# Patient Record
Sex: Female | Born: 1985
Health system: Southern US, Community
[De-identification: ages and names within clinical notes are randomized; demographics above are authoritative.]

## PROBLEM LIST (undated history)

## (undated) DIAGNOSIS — Z8709 Personal history of other diseases of the respiratory system: Secondary | ICD-10-CM

## (undated) DIAGNOSIS — Z8669 Personal history of other diseases of the nervous system and sense organs: Secondary | ICD-10-CM

## (undated) DIAGNOSIS — R87619 Unspecified abnormal cytological findings in specimens from cervix uteri: Secondary | ICD-10-CM

## (undated) DIAGNOSIS — N920 Excessive and frequent menstruation with regular cycle: Secondary | ICD-10-CM

## (undated) DIAGNOSIS — I10 Essential (primary) hypertension: Secondary | ICD-10-CM

## (undated) DIAGNOSIS — A64 Unspecified sexually transmitted disease: Secondary | ICD-10-CM

## (undated) DIAGNOSIS — T7840XA Allergy, unspecified, initial encounter: Secondary | ICD-10-CM

## (undated) DIAGNOSIS — Z87898 Personal history of other specified conditions: Secondary | ICD-10-CM

## (undated) DIAGNOSIS — F3181 Bipolar II disorder: Secondary | ICD-10-CM

## (undated) DIAGNOSIS — E282 Polycystic ovarian syndrome: Secondary | ICD-10-CM

## (undated) DIAGNOSIS — K219 Gastro-esophageal reflux disease without esophagitis: Secondary | ICD-10-CM

## (undated) DIAGNOSIS — F419 Anxiety disorder, unspecified: Secondary | ICD-10-CM

## (undated) DIAGNOSIS — Z8619 Personal history of other infectious and parasitic diseases: Secondary | ICD-10-CM

## (undated) DIAGNOSIS — IMO0002 Reserved for concepts with insufficient information to code with codable children: Secondary | ICD-10-CM

## (undated) DIAGNOSIS — F329 Major depressive disorder, single episode, unspecified: Secondary | ICD-10-CM

## (undated) DIAGNOSIS — N912 Amenorrhea, unspecified: Secondary | ICD-10-CM

## (undated) DIAGNOSIS — F32A Depression, unspecified: Secondary | ICD-10-CM

## (undated) HISTORY — DX: Personal history of other diseases of the respiratory system: Z87.09

## (undated) HISTORY — DX: Unspecified sexually transmitted disease: A64

## (undated) HISTORY — DX: Bipolar II disorder: F31.81

## (undated) HISTORY — DX: Anxiety disorder, unspecified: F41.9

## (undated) HISTORY — DX: Excessive and frequent menstruation with regular cycle: N92.0

## (undated) HISTORY — DX: Polycystic ovarian syndrome: E28.2

## (undated) HISTORY — DX: Personal history of other diseases of the nervous system and sense organs: Z86.69

## (undated) HISTORY — DX: Major depressive disorder, single episode, unspecified: F32.9

## (undated) HISTORY — DX: Reserved for concepts with insufficient information to code with codable children: IMO0002

## (undated) HISTORY — DX: Amenorrhea, unspecified: N91.2

## (undated) HISTORY — DX: Allergy, unspecified, initial encounter: T78.40XA

## (undated) HISTORY — DX: Personal history of other specified conditions: Z87.898

## (undated) HISTORY — DX: Unspecified abnormal cytological findings in specimens from cervix uteri: R87.619

## (undated) HISTORY — DX: Essential (primary) hypertension: I10

## (undated) HISTORY — DX: Personal history of other infectious and parasitic diseases: Z86.19

## (undated) HISTORY — DX: Gastro-esophageal reflux disease without esophagitis: K21.9

## (undated) HISTORY — DX: Depression, unspecified: F32.A

---
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
## 2006-04-11 ENCOUNTER — Other Ambulatory Visit: Admission: RE | Admit: 2006-04-11 | Discharge: 2006-04-11 | Payer: Self-pay | Admitting: Obstetrics & Gynecology

## 2006-08-07 ENCOUNTER — Ambulatory Visit: Payer: Self-pay | Admitting: Internal Medicine

## 2006-08-08 ENCOUNTER — Encounter: Admission: RE | Admit: 2006-08-08 | Discharge: 2006-08-08 | Payer: Self-pay | Admitting: Internal Medicine

## 2006-10-11 ENCOUNTER — Ambulatory Visit: Payer: Self-pay | Admitting: Internal Medicine

## 2007-02-07 ENCOUNTER — Ambulatory Visit: Payer: Self-pay | Admitting: Internal Medicine

## 2008-05-23 ENCOUNTER — Emergency Department (HOSPITAL_BASED_OUTPATIENT_CLINIC_OR_DEPARTMENT_OTHER): Admission: EM | Admit: 2008-05-23 | Discharge: 2008-05-23 | Payer: Self-pay | Admitting: Emergency Medicine

## 2008-07-17 ENCOUNTER — Other Ambulatory Visit: Admission: RE | Admit: 2008-07-17 | Discharge: 2008-07-17 | Payer: Self-pay | Admitting: Obstetrics and Gynecology

## 2010-07-12 LAB — HM PAP SMEAR: HM Pap smear: NORMAL

## 2011-03-21 ENCOUNTER — Encounter: Payer: Self-pay | Admitting: Internal Medicine

## 2011-03-21 ENCOUNTER — Ambulatory Visit (INDEPENDENT_AMBULATORY_CARE_PROVIDER_SITE_OTHER): Payer: BC Managed Care – PPO | Admitting: Internal Medicine

## 2011-03-21 DIAGNOSIS — M545 Low back pain, unspecified: Secondary | ICD-10-CM

## 2011-03-21 DIAGNOSIS — J45909 Unspecified asthma, uncomplicated: Secondary | ICD-10-CM | POA: Insufficient documentation

## 2011-03-21 DIAGNOSIS — K5900 Constipation, unspecified: Secondary | ICD-10-CM | POA: Insufficient documentation

## 2011-03-21 DIAGNOSIS — G43909 Migraine, unspecified, not intractable, without status migrainosus: Secondary | ICD-10-CM | POA: Insufficient documentation

## 2011-03-21 DIAGNOSIS — J309 Allergic rhinitis, unspecified: Secondary | ICD-10-CM

## 2011-03-21 NOTE — Progress Notes (Signed)
  Subjective:    Patient ID: Jessica Castro, female    DOB: [birth year], 25 y.o.   MRN: [id number]  HPI Pt presents to clinic to establish care and for evaluation of constipation. Notes one year h/o constipation that specifically began after taking short course of vicodin for back pain. Since that time has had chronic intermittent constipation with small difficult to pass bm. Sometimes associated with cramping and gas. Seen by multiple providers and recalls reportedly nl thyroid test and labwork in general, a plain radiograph that suggested constipation without mention of obstruction and did see GI without any studies. Has changed her diet, increased fluid intake, attempted one month of benefiber, miralax all without significant improvement. During the year had increased anxiety and attempted prozac but stopped due to side effects. Did see a Veterinary surgeon. Denies recent anxiety. H/o lumbar back pain with reported herniated disc which flared in 2007 and 2011. S/p nsaids and PT. No recent back pain. No other alleviating or exacerbating factors.   Reviewed pmh, psh, medications, allergies, soc hx and fam hx  Review of Systems  Constitutional: Negative for fatigue and unexpected weight change.  Gastrointestinal: Positive for constipation. Negative for nausea, abdominal pain, diarrhea, blood in stool and abdominal distention.  Skin: Negative for color change and rash.  Neurological: Positive for headaches. Negative for seizures.  Psychiatric/Behavioral: Negative for dysphoric mood. The patient is not nervous/anxious.        Objective:   Physical Exam  Physical Exam  Nursing note and vitals reviewed. Constitutional: Appears well-developed and well-nourished. No distress.  HENT:  Head: Normocephalic and atraumatic.  Right Ear: External ear normal.  Left Ear: External ear normal.  Eyes: Conjunctivae are normal. No scleral icterus.  Neck: Neck supple.  Cardiovascular: Normal rate, regular rhythm and  normal heart sounds.  Exam reveals no gallop and no friction rub.   No murmur heard. Pulmonary/Chest: Effort normal and breath sounds normal. No respiratory distress. He has no wheezes. no rales.  Abd: soft, nd, +bs. No masses or organomegaly. Mild diffuse tenderness more prominent lower quandrants. No rebound/guarding or rigidity. Lymphadenopathy:    He has no cervical adenopathy.  Neurological:Alert.  Skin: Skin is warm and dry. Not diaphoretic.  Psychiatric: Has a normal mood and affect.        Assessment & Plan:

## 2011-03-21 NOTE — Assessment & Plan Note (Signed)
Reviewed workup/evaluation in detail with patient. May represent constipation dominant IBS that occurred after narcotic. Workup to date unrevealing. Attempt 3wk course of align qd (samples provided.) Recommend GI followup (pt in the process of scheduling.) Schedule f/u.

## 2011-05-19 ENCOUNTER — Ambulatory Visit: Payer: BC Managed Care – PPO | Admitting: Internal Medicine

## 2011-06-01 ENCOUNTER — Encounter: Payer: Self-pay | Admitting: Family

## 2011-06-01 ENCOUNTER — Ambulatory Visit (INDEPENDENT_AMBULATORY_CARE_PROVIDER_SITE_OTHER): Payer: BC Managed Care – PPO | Admitting: Family

## 2011-06-01 DIAGNOSIS — B373 Candidiasis of vulva and vagina: Secondary | ICD-10-CM

## 2011-06-01 DIAGNOSIS — B3731 Acute candidiasis of vulva and vagina: Secondary | ICD-10-CM

## 2011-06-01 DIAGNOSIS — K649 Unspecified hemorrhoids: Secondary | ICD-10-CM

## 2011-06-01 DIAGNOSIS — N898 Other specified noninflammatory disorders of vagina: Secondary | ICD-10-CM

## 2011-06-01 LAB — BASIC METABOLIC PANEL
CO2: 24 mEq/L (ref 19–32)
Chloride: 107 mEq/L (ref 96–112)

## 2011-06-01 MED ORDER — FLUCONAZOLE 150 MG PO TABS: ORAL_TABLET | ORAL | Status: DC

## 2011-06-01 NOTE — Patient Instructions (Signed)
We will let you know how your swab results turn out. Call us if symptoms worsen, or if not resolved in 1 week.

## 2011-06-01 NOTE — Progress Notes (Signed)
  Subjective:    Patient ID: Jessica Castro, female    DOB: 08/22/1985, 25 y.o.   MRN: [id number]  HPI  Pt was treated with cipro for bronchitis/sinusitis 10 days ago.  She developed vaginal discomfort last night.  Rectal area has been itching x 3-4 days.  Notes that she has a hemorrhoid which has been bleeding.  + white thick discharge since last night.  Denies risk factors for STD exposures.    Review of Systems See HPI  Past Medical History  Diagnosis Date  . Asthma   . History of chicken pox     childhood  . Depression     treated with anxiety- current  counseling   . History of bronchitis   . History of headache   . GERD (gastroesophageal reflux disease)   . Allergic rhinitis     Allergy- shots with Dr Sharyn Lull  . History of migraines   . Herniated disc     Neck and  back    History   Social History  . Marital Status: Single    Spouse Name: N/A    Number of Children: N/A  . Years of Education: N/A   Occupational History  . Not on file.   Social History Main Topics  . Smoking status: Never Smoker   . Smokeless tobacco: Never Used  . Alcohol Use: Yes     1 tiwce a month  . Drug Use: No  . Sexually Active: Not on file   Other Topics Concern  . Not on file   Social History Narrative  . No narrative on file    Past Surgical History  Procedure Date  . Wisdom tooth extraction 2003    Family History  Problem Relation Age of Onset  . Alcohol abuse Mother   . Alcohol abuse      maternal/paternal grandparents  . Hyperlipidemia Mother   . Hyperlipidemia Father   . Hyperlipidemia Maternal Grandmother   . Stroke Maternal Grandmother   . Pancreatic cancer Paternal Grandfather     No Known Allergies  Current Outpatient Prescriptions on File Prior to Visit  Medication Sig Dispense Refill  . albuterol (PROVENTIL HFA;VENTOLIN HFA) 108 (90 BASE) MCG/ACT inhaler Inhale 2 puffs into the lungs every 6 (six) hours as needed.        . cetirizine (ZYRTEC) 10 MG tablet  Take 10 mg by mouth daily.        . mometasone (NASONEX) 50 MCG/ACT nasal spray Place 2 sprays into the nose daily.        . montelukast (SINGULAIR) 10 MG tablet Take 10 mg by mouth at bedtime.        . Norethindrone Acetate-Ethinyl Estrad-FE (LOESTRIN 24 FE) 1-20 MG-MCG(24) tablet Take 1 tablet by mouth daily.          BP 122/80  Pulse 90  Temp(Src) 97.9 F (36.6 C) (Oral)  Resp 16  Wt 259 lb (117.482 kg)  LMP 04/29/2011       Objective:   Physical Exam  Constitutional: She appears well-developed and well-nourished.  Genitourinary:       Vulva is erythematous.  Erythema extends between gluteal folds and in the perianal area.  Wet prep obtained today with swab. Pt was unable tolerate a speculum insertion due to discomfort.            Assessment & Plan:

## 2011-06-02 ENCOUNTER — Encounter: Payer: Self-pay | Admitting: Family

## 2011-06-02 DIAGNOSIS — K649 Unspecified hemorrhoids: Secondary | ICD-10-CM | POA: Insufficient documentation

## 2011-06-02 DIAGNOSIS — B373 Candidiasis of vulva and vagina: Secondary | ICD-10-CM | POA: Insufficient documentation

## 2011-06-02 LAB — WET PREP BY MOLECULAR PROBE
Candida species: POSITIVE — AB
Gardnerella vaginalis: NEGATIVE

## 2011-06-02 NOTE — Assessment & Plan Note (Signed)
She reports intermittent issue with a hemorrhoid that bleeds.  I did not visualize and external hemorrhoid today on exam. She is instructed to contact us if she develops rectal pain or worsening/recurrent rectal bleeding.

## 2011-06-02 NOTE — Assessment & Plan Note (Signed)
Wet prep is + for candida.  Will plan to treat with Diflucan.  I recommended topical application of monistat to the exterior skin areas/

## 2012-05-07 ENCOUNTER — Ambulatory Visit (INDEPENDENT_AMBULATORY_CARE_PROVIDER_SITE_OTHER): Payer: BC Managed Care – PPO | Admitting: Internal Medicine

## 2012-05-07 VITALS — BP 106/70 | HR 99 | Temp 98.0°F | Wt 295.0 lb

## 2012-05-07 DIAGNOSIS — R358 Other polyuria: Secondary | ICD-10-CM

## 2012-05-07 DIAGNOSIS — R3589 Other polyuria: Secondary | ICD-10-CM

## 2012-05-07 DIAGNOSIS — R350 Frequency of micturition: Secondary | ICD-10-CM

## 2012-05-07 MED ORDER — CIPROFLOXACIN HCL 500 MG PO TABS: 500.0000 mg | ORAL_TABLET | Freq: Two times a day (BID) | ORAL | Status: DC

## 2012-05-08 LAB — URINALYSIS, ROUTINE W REFLEX MICROSCOPIC
Glucose, UA: NEGATIVE mg/dL
Nitrite: NEGATIVE
Urobilinogen, UA: 0.2 mg/dL (ref 0.0–1.0)
pH: 5.5 (ref 5.0–8.0)

## 2012-05-08 LAB — URINALYSIS, MICROSCOPIC ONLY
Casts: NONE SEEN
Crystals: NONE SEEN

## 2012-05-08 LAB — BASIC METABOLIC PANEL
CO2: 25 mEq/L (ref 19–32)
Chloride: 105 mEq/L (ref 96–112)
Creat: 0.83 mg/dL (ref 0.50–1.10)
Potassium: 3.8 mEq/L (ref 3.5–5.3)

## 2012-05-09 ENCOUNTER — Encounter: Payer: Self-pay | Admitting: Internal Medicine

## 2012-05-09 DIAGNOSIS — R350 Frequency of micturition: Secondary | ICD-10-CM | POA: Insufficient documentation

## 2012-05-09 LAB — URINE CULTURE: Colony Count: 50000

## 2012-05-09 NOTE — Progress Notes (Signed)
  Subjective:    Patient ID: Eugenia Pancoast, female    DOB: 01/05/1986, 26 y.o.   MRN: [id number]  HPI patient presents to clinic for evaluation of urinary frequency. There is a one month history of increased urination without polyuria and also nocturia. Denies hematuria, dysuria, nausea vomiting, fever or chills. Has had recent weight gain. There may be some mild right low back pain without radicular symptoms. No other alleviating or exacerbating factors. Taking no medication for the problem.  Past Medical History  Diagnosis Date  . Asthma   . History of chicken pox     childhood  . Depression     treated with anxiety- current  counseling   . History of bronchitis   . History of headache   . GERD (gastroesophageal reflux disease)   . Allergic rhinitis     Allergy- shots with Dr Sharyn Lull  . History of migraines   . Herniated disc     Neck and  back   Past Surgical History  Procedure Date  . Wisdom tooth extraction 2003    reports that she has never smoked. She has never used smokeless tobacco. She reports that she drinks alcohol. She reports that she does not use illicit drugs. family history includes Alcohol abuse in her mother and unspecified family member; Hyperlipidemia in her father, maternal grandmother, and mother; Pancreatic cancer in her paternal grandfather; and Stroke in her maternal grandmother. No Known Allergies   Review of Systems see history of present illness     Objective:   Physical Exam  Nursing note and vitals reviewed. Constitutional: She appears well-developed and well-nourished. No distress.  Skin: She is not diaphoretic.          Assessment & Plan:

## 2012-05-09 NOTE — Assessment & Plan Note (Signed)
Obtain urinalysis and culture. Begin five-day course of Cipro. Obtain chem seven (nearly fasting)

## 2012-05-15 ENCOUNTER — Telehealth: Payer: Self-pay | Admitting: *Deleted

## 2012-05-15 NOTE — Telephone Encounter (Signed)
Pt called requesting results from her urine cx. Please advise.

## 2012-05-15 NOTE — Telephone Encounter (Signed)
Don't typically notify urine cx results unless it prompts a change in abx. Only grew a slight amount of bacteria and unable to type. She was instructed to notify clinic if sx's did not resolve with abx

## 2012-05-16 NOTE — Telephone Encounter (Signed)
Patient Informed & also informed of Normal results of BMET/SLS

## 2012-06-19 ENCOUNTER — Telehealth: Payer: Self-pay | Admitting: Internal Medicine

## 2012-06-19 MED ORDER — CIPROFLOXACIN HCL 500 MG PO TABS: 500.0000 mg | ORAL_TABLET | Freq: Two times a day (BID) | ORAL | Status: DC

## 2012-06-19 NOTE — Telephone Encounter (Signed)
Patient states that she came in a few weeks ago with a bladder infection. She was taking antibiotics and the infection went away. She now states that same symptoms are back again and would like more antibiotics to be sent to [facility] on [address]

## 2012-06-19 NOTE — Telephone Encounter (Signed)
Rx to pharmacy; informed patient/SLS 

## 2012-06-19 NOTE — Telephone Encounter (Signed)
Ok to repeat cipro

## 2012-08-04 ENCOUNTER — Other Ambulatory Visit: Payer: Self-pay | Admitting: Physician Assistant

## 2012-08-04 ENCOUNTER — Ambulatory Visit (INDEPENDENT_AMBULATORY_CARE_PROVIDER_SITE_OTHER): Payer: BC Managed Care – PPO | Admitting: Physician Assistant

## 2012-08-04 VITALS — BP 133/76 | HR 92 | Temp 98.2°F | Resp 18 | Ht 67.0 in | Wt 303.0 lb

## 2012-08-04 DIAGNOSIS — R0981 Nasal congestion: Secondary | ICD-10-CM

## 2012-08-04 DIAGNOSIS — R059 Cough, unspecified: Secondary | ICD-10-CM

## 2012-08-04 DIAGNOSIS — J3489 Other specified disorders of nose and nasal sinuses: Secondary | ICD-10-CM

## 2012-08-04 DIAGNOSIS — R05 Cough: Secondary | ICD-10-CM

## 2012-08-04 DIAGNOSIS — J309 Allergic rhinitis, unspecified: Secondary | ICD-10-CM

## 2012-08-04 MED ORDER — MONTELUKAST SODIUM 10 MG PO TABS: 10.0000 mg | ORAL_TABLET | Freq: Every day | ORAL | Status: DC

## 2012-08-04 NOTE — Progress Notes (Signed)
  Subjective:    Patient ID: Jessica Castro, female    DOB: Jan 19, 1986, 27 y.o.   MRN: [id number]  HPI 27 year old female presents for refills of singulair.  States she normally takes this daily for her allergies and realized she was out today.  Admits that if she does not take this, especially if she develops a cold, she will end up with bronchitis or sinusitis.  Has history of seeing allergist who diagnosed her with both seasonal allergies and dust allergies.  She does use nasonex and zyrtec daily.  Admits that for the past 2 days she has cough and nasal congestion. Denies fever, chills, nausea, vomiting, or headache.  Has been taking mucinex and advil which have helped.  She is otherwise healthy with no other complaints today.     Review of Systems  Constitutional: Negative for fever and chills.  HENT: Positive for congestion, rhinorrhea and postnasal drip. Negative for sore throat.   Respiratory: Positive for cough. Negative for chest tightness, shortness of breath and wheezing.   Cardiovascular: Negative for chest pain.  Gastrointestinal: Negative for nausea, vomiting and abdominal pain.  Neurological: Negative for dizziness and headaches.  All other systems reviewed and are negative.       Objective:   Physical Exam  Constitutional: She is oriented to person, place, and time. She appears well-developed and well-nourished.  HENT:  Head: Normocephalic and atraumatic.  Right Ear: External ear normal.  Left Ear: External ear normal.  Nose: Nose normal.  Mouth/Throat: Oropharynx is clear and moist. No oropharyngeal exudate.  Eyes: Conjunctivae normal are normal.  Neck: Normal range of motion. Neck supple.  Cardiovascular: Normal rate, regular rhythm and normal heart sounds.   Pulmonary/Chest: Effort normal and breath sounds normal.  Lymphadenopathy:    She has no cervical adenopathy.  Neurological: She is alert and oriented to person, place, and time.  Psychiatric: She has a normal  mood and affect. Her behavior is normal. Judgment and thought content normal.          Assessment & Plan:   1. Allergic rhinitis   2. Cough   3. Nasal congestion   Likely viral URI - continue Mucinex, zyrtec, and nasonex. Re-start singulair.  If no improvement in 48-72 hours with same symptoms, ok to call in Zpack Follow up if symptoms worsen or fever develops.

## 2012-08-06 ENCOUNTER — Telehealth: Payer: Self-pay

## 2012-08-06 MED ORDER — AZITHROMYCIN 250 MG PO TABS: ORAL_TABLET | ORAL | Status: DC

## 2012-08-06 NOTE — Telephone Encounter (Signed)
Per office note from [place], its ok to call in Zpak. Called pt to let her know Rx sent in.

## 2012-08-06 NOTE — Telephone Encounter (Signed)
PT WAS HERE THIS WEEKEND AND WAS TOLD TO CALL BACK IF SHE DIDN'T FEEL ANY BETTER SO THAT WE COULD CALL HER IN AN ANTIBIOTIC.  [phone]

## 2012-10-29 ENCOUNTER — Encounter: Payer: Self-pay | Admitting: Nurse Practitioner

## 2012-10-29 ENCOUNTER — Ambulatory Visit (INDEPENDENT_AMBULATORY_CARE_PROVIDER_SITE_OTHER): Payer: BC Managed Care – PPO | Admitting: Nurse Practitioner

## 2012-10-29 ENCOUNTER — Telehealth: Payer: Self-pay | Admitting: Nurse Practitioner

## 2012-10-29 VITALS — BP 110/70 | HR 68 | Resp 18 | Wt 296.0 lb

## 2012-10-29 DIAGNOSIS — Z309 Encounter for contraceptive management, unspecified: Secondary | ICD-10-CM

## 2012-10-29 DIAGNOSIS — IMO0001 Reserved for inherently not codable concepts without codable children: Secondary | ICD-10-CM

## 2012-10-29 DIAGNOSIS — N898 Other specified noninflammatory disorders of vagina: Secondary | ICD-10-CM

## 2012-10-29 DIAGNOSIS — N390 Urinary tract infection, site not specified: Secondary | ICD-10-CM

## 2012-10-29 LAB — POCT WET PREP (WET MOUNT): Bacteria Wet Prep HPF POC: POSITIVE

## 2012-10-29 LAB — POCT URINALYSIS DIPSTICK
Bilirubin, UA: NEGATIVE
Nitrite, UA: NEGATIVE
Protein, UA: NEGATIVE

## 2012-10-29 MED ORDER — METRONIDAZOLE 500 MG PO TABS: 500.0000 mg | ORAL_TABLET | Freq: Two times a day (BID) | ORAL | Status: DC

## 2012-10-29 MED ORDER — ETONOGESTREL-ETHINYL ESTRADIOL 0.12-0.015 MG/24HR VA RING: VAGINAL_RING | VAGINAL | Status: DC

## 2012-10-29 NOTE — Progress Notes (Signed)
Subjective:     Patient ID: Jessica Castro, female   DOB: [birth year], 27 y.o.   MRN: [id number]  Urinary Tract Infection  This is a new problem. The current episode started more than 1 month ago. The problem occurs intermittently. The problem has been waxing and waning. The patient is experiencing no pain. There has been no fever. She is not sexually active. Associated symptoms include frequency, hesitancy and urgency. Pertinent negatives include no chills, nausea or vomiting. She has tried antibiotics (Treated in the fall, but follow up culture was negative.) for the symptoms. The treatment provided mild relief.     Review of Systems  Constitutional: Negative.  Negative for fever, chills and fatigue.  Respiratory: Negative.   Cardiovascular: Negative.   Gastrointestinal: Negative.  Negative for nausea, vomiting and constipation.  Genitourinary: Positive for hesitancy, urgency and frequency.       She has been using the [employer] since mid February.  She initially had increase in vaginal discharge that improved. She is unsure if Ring is causing an increase in the symptoms.   Currently not sexually active.  Psychiatric/Behavioral: Negative.        Objective:   Physical Exam  Constitutional: She is oriented to person, place, and time. She appears well-developed and well-nourished.  Cardiovascular: Normal rate.   Pulmonary/Chest: Effort normal.  Abdominal: Soft. She exhibits no distension and no mass. There is no tenderness. There is no rebound and no guarding.  Genitourinary: Vagina normal.  Some clear to grey vaginal discharge. Wet prep + clue cells.  Neurological: She is alert and oriented to person, place, and time.  Skin: Skin is warm and dry.       Assessment:      Rule out UTI  BV  Doubt symptoms are related to Nuva Ring - but will  continue to monitor    Plan:      Flagyl 500 mg. BID for 1 week caution with GI and ETOH  Side effects.  Call patient with Urine culture  results

## 2012-10-29 NOTE — Telephone Encounter (Signed)
Pt is having some issues with her nuvaring and also needs a new prescription for it. Pt believes she also has a UTI but not sure if the problems are connected; the UTI and problems with the Nuvaring started around the same time. Please call.

## 2012-10-29 NOTE — Patient Instructions (Addendum)
Bacterial Vaginosis Bacterial vaginosis (BV) is a vaginal infection where the normal balance of bacteria in the vagina is disrupted. The normal balance is then replaced by an overgrowth of certain bacteria. There are several different kinds of bacteria that can cause BV. BV is the most common vaginal infection in women of childbearing age. CAUSES   The cause of BV is not fully understood. BV develops when there is an increase or imbalance of harmful bacteria.  Some activities or behaviors can upset the normal balance of bacteria in the vagina and put women at increased risk including:  Having a new sex partner or multiple sex partners.  Douching.  Using an intrauterine device (IUD) for contraception.  It is not clear what role sexual activity plays in the development of BV. However, women that have never had sexual intercourse are rarely infected with BV. Women do not get BV from toilet seats, bedding, swimming pools or from touching objects around them.  SYMPTOMS   Grey vaginal discharge.  A fish-like odor with discharge, especially after sexual intercourse.  Itching or burning of the vagina and vulva.  Burning or pain with urination.  Some women have no signs or symptoms at all. DIAGNOSIS  Your caregiver must examine the vagina for signs of BV. Your caregiver will perform lab tests and look at the sample of vaginal fluid through a microscope. They will look for bacteria and abnormal cells (clue cells), a pH test higher than 4.5, and a positive amine test all associated with BV.  RISKS AND COMPLICATIONS   Pelvic inflammatory disease (PID).  Infections following gynecology surgery.  Developing HIV.  Developing herpes virus. TREATMENT  Sometimes BV will clear up without treatment. However, all women with symptoms of BV should be treated to avoid complications, especially if gynecology surgery is planned. Female partners generally do not need to be treated. However, BV may spread  between female sex partners so treatment is helpful in preventing a recurrence of BV.   BV may be treated with antibiotics. The antibiotics come in either pill or vaginal cream forms. Either can be used with nonpregnant or pregnant women, but the recommended dosages differ. These antibiotics are not harmful to the baby.  BV can recur after treatment. If this happens, a second round of antibiotics will often be prescribed.  Treatment is important for pregnant women. If not treated, BV can cause a premature delivery, especially for a pregnant woman who had a premature birth in the past. All pregnant women who have symptoms of BV should be checked and treated.  For chronic reoccurrence of BV, treatment with a type of prescribed gel vaginally twice a week is helpful. HOME CARE INSTRUCTIONS   Finish all medication as directed by your caregiver.  Do not have sex until treatment is completed.  Tell your sexual partner that you have a vaginal infection. They should see their caregiver and be treated if they have problems, such as a mild rash or itching.  Practice safe sex. Use condoms. Only have 1 sex partner. PREVENTION  Basic prevention steps can help reduce the risk of upsetting the natural balance of bacteria in the vagina and developing BV:  Do not have sexual intercourse (be abstinent).  Do not douche.  Use all of the medicine prescribed for treatment of BV, even if the signs and symptoms go away.  Tell your sex partner if you have BV. That way, they can be treated, if needed, to prevent reoccurrence. SEEK MEDICAL CARE IF:     Your symptoms are not improving after 3 days of treatment.  You have increased discharge, pain, or fever. MAKE SURE YOU:   Understand these instructions.  Will watch your condition.  Will get help right away if you are not doing well or get worse. FOR MORE INFORMATION  Division of STD Prevention (DSTDP), Centers for Disease Control and Prevention:  www.cdc.gov/std American Social Health Association (ASHA): www.ashastd.org  Document Released: 07/18/2005 Document Revised: 10/10/2011 Document Reviewed: 01/08/2009 Exit[employer] Patient Information 2013 [employer].  

## 2012-10-29 NOTE — Telephone Encounter (Signed)
Patient states has been having problem with Nuvaring and also thinks has UTI. Symptoms all started at the same time. Appt. Given for today to see Rock Nephew.   sue

## 2012-10-30 NOTE — Progress Notes (Signed)
Encounter reviewed by Dr. Brook Silva.  

## 2012-10-31 LAB — URINE CULTURE: Colony Count: 9000

## 2012-11-13 ENCOUNTER — Telehealth: Payer: Self-pay | Admitting: Certified Nurse Midwife

## 2012-11-13 NOTE — Telephone Encounter (Signed)
Patient notified of lab results urine culture negative.

## 2012-11-13 NOTE — Telephone Encounter (Signed)
pt is returning a call from last week and does not know who called her believes it is about her results

## 2013-01-10 ENCOUNTER — Ambulatory Visit: Payer: BC Managed Care – PPO | Admitting: Certified Nurse Midwife

## 2013-01-26 ENCOUNTER — Other Ambulatory Visit: Payer: Self-pay | Admitting: Nurse Practitioner

## 2013-01-28 ENCOUNTER — Telehealth: Payer: Self-pay | Admitting: *Deleted

## 2013-01-28 DIAGNOSIS — IMO0001 Reserved for inherently not codable concepts without codable children: Secondary | ICD-10-CM

## 2013-01-28 MED ORDER — ETONOGESTREL-ETHINYL ESTRADIOL 0.12-0.015 MG/24HR VA RING: VAGINAL_RING | VAGINAL | Status: DC

## 2013-01-28 NOTE — Telephone Encounter (Signed)
pt was given Nuvaring #3 on 10/29/2012. Pt needs 1 Nuvaring sent into pharmacy to maintain coverage until aex with D.Leonard on 02/11/2013.

## 2013-02-11 ENCOUNTER — Encounter: Payer: Self-pay | Admitting: Certified Nurse Midwife

## 2013-02-11 ENCOUNTER — Ambulatory Visit (INDEPENDENT_AMBULATORY_CARE_PROVIDER_SITE_OTHER): Payer: BC Managed Care – PPO | Admitting: Certified Nurse Midwife

## 2013-02-11 VITALS — BP 114/80 | HR 60 | Resp 16 | Ht 67.0 in | Wt 309.0 lb

## 2013-02-11 DIAGNOSIS — Z309 Encounter for contraceptive management, unspecified: Secondary | ICD-10-CM

## 2013-02-11 DIAGNOSIS — Z0000 Encounter for general adult medical examination without abnormal findings: Secondary | ICD-10-CM

## 2013-02-11 DIAGNOSIS — Z01419 Encounter for gynecological examination (general) (routine) without abnormal findings: Secondary | ICD-10-CM

## 2013-02-11 DIAGNOSIS — IMO0001 Reserved for inherently not codable concepts without codable children: Secondary | ICD-10-CM

## 2013-02-11 MED ORDER — ETONOGESTREL-ETHINYL ESTRADIOL 0.12-0.015 MG/24HR VA RING: 1.0000 | VAGINAL_RING | Freq: Once | VAGINAL | Status: DC

## 2013-02-11 NOTE — Progress Notes (Signed)
27 y.o. G0P0000 Single Caucasian Fe here for annual exam. Periods normal. Contraception working well. Not sexually active. No STD screening desired. Working on weight loss finally now! Has new job she loves," so my anxiety is much better". Medications now stable with MD treating her anxiety. No health issues today.  Patient's last menstrual period was 02/08/2013.          Sexually active: no  The current method of family planning is NuvaRing vaginal inserts.    Exercising: no  exercise Smoker:  no  Health Maintenance: Pap:  09-06-10 neg MMG:  none Colonoscopy:  none BMD:   none TDaP:  2006 Labs: none Self breast exam: done occ   reports that she has never smoked. She has never used smokeless tobacco. She reports that  drinks alcohol. She reports that she does not use illicit drugs.  Past Medical History  Diagnosis Date  . Asthma   . History of chicken pox     childhood  . Depression     treated with anxiety- current  counseling   . History of bronchitis   . History of headache   . GERD (gastroesophageal reflux disease)   . Allergic rhinitis     Allergy- shots with Dr Sharyn Lull  . History of migraines   . Herniated disc     back  . Menorrhagia   . Anxiety     has a history of panic disorder  . Amenorrhea     Past Surgical History  Procedure Laterality Date  . Wisdom tooth extraction  2003    Current Outpatient Prescriptions  Medication Sig Dispense Refill  . albuterol (PROVENTIL HFA;VENTOLIN HFA) 108 (90 BASE) MCG/ACT inhaler Inhale 2 puffs into the lungs every 6 (six) hours as needed.        . cetirizine (ZYRTEC) 10 MG tablet Take 10 mg by mouth daily.        . clonazePAM (KLONOPIN) 0.5 MG tablet Take 1/2 tablet twice a week as needed .       . etonogestrel-ethinyl estradiol (NUVARING) 0.12-0.015 MG/24HR vaginal ring Insert vaginally and leave in place for 3 consecutive weeks, then remove for 1 week.  1 each  0  . mometasone (NASONEX) 50 MCG/ACT nasal spray Place 2  sprays into the nose as needed.       . montelukast (SINGULAIR) 10 MG tablet Take 1 tablet (10 mg total) by mouth at bedtime.  30 tablet  5  . OMEPRAZOLE PO Take 1 tablet by mouth daily.        . Sertraline HCl (ZOLOFT PO) Take 1 tablet by mouth daily.        No current facility-administered medications for this visit.    Family History  Problem Relation Age of Onset  . Alcohol abuse Mother   . Hyperlipidemia Mother   . Hyperlipidemia Father   . Hyperlipidemia Maternal Grandmother   . Alzheimer's disease Maternal Grandmother   . Pancreatic cancer Paternal Grandfather   . Stroke Maternal Grandfather   . Alcohol abuse Maternal Grandfather     ROS:  Pertinent items are noted in HPI.  Otherwise, a comprehensive ROS was negative.  Exam:   BP 114/80  Pulse 60  Resp 16  Ht 5\' 7"  (1.702 m)  Wt 309 lb (140.161 kg)  BMI 48.38 kg/m2  LMP 02/08/2013 Height: 5\' 7"  (170.2 cm)  Ht Readings from Last 3 Encounters:  02/11/13 5\' 7"  (1.702 m)  08/04/12 5\' 7"  (1.702 m)  03/21/11 5' 6.5" (  1.689 m)    General appearance: alert, cooperative and appears stated age Head: Normocephalic, without obvious abnormality, atraumatic Neck: no adenopathy, supple, symmetrical, trachea midline and thyroid normal to inspection and palpation Lungs: clear to auscultation bilaterally Breasts: normal appearance, no masses or tenderness, No nipple retraction or dimpling, No nipple discharge or bleeding, No axillary or supraclavicular adenopathy Heart: regular rate and rhythm Abdomen: soft, non-tender; no masses,  no organomegaly Extremities: extremities normal, atraumatic, no cyanosis or edema Skin: Skin color, texture, turgor normal. No rashes or lesions Lymph nodes: Cervical, supraclavicular, and axillary nodes normal. No abnormal inguinal nodes palpated Neurologic: Grossly normal   Pelvic: External genitalia:  no lesions              Urethra:  normal appearing urethra with no masses, tenderness or  lesions              Bartholin's and Skene's: normal                 Vagina: normal appearing vagina with normal color and discharge, no lesions              Cervix: normal, non tender              Pap taken: no Bimanual Exam:  Uterus:  normal size, contour, position, consistency, mobility, non-tender and anteflexed              Adnexa: normal adnexa and no mass, fullness, tenderness               Rectovaginal: Confirms               Anus:  normal sphincter tone, no lesions  A:  Well Woman with normal exam   Contraception: Nuvaring  Morbid Obesity  Anxiety on stable medication with MD management  P:   Reviewed health and wellness pertinent to exam  Rx Nuvaring see order  Discussed importance of weight loss to decrease health risk of diabetes, hypertension as well as cancer risk. Patient has worked with weight loss before with good success, plans same with exercise. Works at [employer], so has Hospital doctor.  Labs:Lipid panel, CMP Hgb A-1c(non fasting)  Continue follow up as indicated.  Pap smear as per guidelines   pap smear not taken today  counseled on breast self exam, STD prevention, adequate intake of calcium and vitamin D, diet and exercise  return annually or prn  An After Visit Summary was printed and given to the patient.  Reviewed, TL

## 2013-02-11 NOTE — Patient Instructions (Addendum)
General topics  Next pap or exam is  due in 1 year Take a Women's multivitamin Take 1200 mg. of calcium daily - prefer dietary If any concerns in interim to call back  Breast Self-Awareness Practicing breast self-awareness may pick up problems early, prevent significant medical complications, and possibly save your life. By practicing breast self-awareness, you can become familiar with how your breasts look and feel and if your breasts are changing. This allows you to notice changes early. It can also offer you some reassurance that your breast health is good. One way to learn what is normal for your breasts and whether your breasts are changing is to do a breast self-exam. If you find a lump or something that was not present in the past, it is best to contact your caregiver right away. Other findings that should be evaluated by your caregiver include nipple discharge, especially if it is bloody; skin changes or reddening; areas where the skin seems to be pulled in (retracted); or new lumps and bumps. Breast pain is seldom associated with cancer (malignancy), but should also be evaluated by a caregiver. BREAST SELF-EXAM The best time to examine your breasts is 5 7 days after your menstrual period is over.  ExitCare Patient Information 2013 [employer].   Exercise to Stay Healthy Exercise helps you become and stay healthy. EXERCISE IDEAS AND TIPS Choose exercises that:  You enjoy.  Fit into your day. You do not need to exercise really hard to be healthy. You can do exercises at a slow or medium level and stay healthy. You can:  Stretch before and after working out.  Try yoga, Pilates, or tai chi.  Lift weights.  Walk fast, swim, jog, run, climb stairs, bicycle, dance, or rollerskate.  Take aerobic classes. Exercises that burn about 150 calories:  Running 1  miles in 15 minutes.  Playing volleyball for 45 to 60 minutes.  Washing and waxing a car for 45 to 60  minutes.  Playing touch football for 45 minutes.  Walking 1  miles in 35 minutes.  Pushing a stroller 1  miles in 30 minutes.  Playing basketball for 30 minutes.  Raking leaves for 30 minutes.  Bicycling 5 miles in 30 minutes.  Walking 2 miles in 30 minutes.  Dancing for 30 minutes.  Shoveling snow for 15 minutes.  Swimming laps for 20 minutes.  Walking up stairs for 15 minutes.  Bicycling 4 miles in 15 minutes.  Gardening for 30 to 45 minutes.  Jumping rope for 15 minutes.  Washing windows or floors for 45 to 60 minutes. Document Released: 08/20/2010 Document Revised: 10/10/2011 Document Reviewed: 08/20/2010 ExitCare Patient Information 2013 [employer].   Other topics ( that may be useful information):    Sexually Transmitted Disease Sexually transmitted disease (STD) refers to any infection that is passed from person to person during sexual activity. This may happen by way of saliva, semen, blood, vaginal mucus, or urine. Common STDs include:  Gonorrhea.  Chlamydia.  Syphilis.  HIV/AIDS.  Genital herpes.  Hepatitis B and C.  Trichomonas.  Human papillomavirus (HPV).  Pubic lice. CAUSES  An STD may be spread by bacteria, virus, or parasite. A person can get an STD by:  Sexual intercourse with an infected person.  Sharing sex toys with an infected person.  Sharing needles with an infected person.  Having intimate contact with the genitals, mouth, or rectal areas of an infected person. SYMPTOMS  Some people may not have any symptoms, but   they can still pass the infection to others. Different STDs have different symptoms. Symptoms include:  Painful or bloody urination.  Pain in the pelvis, abdomen, vagina, anus, throat, or eyes.  Skin rash, itching, irritation, growths, or sores (lesions). These usually occur in the genital or anal area.  Abnormal vaginal discharge.  Penile discharge in men.  Soft, flesh-colored skin growths in the  genital or anal area.  Fever.  Pain or bleeding during sexual intercourse.  Swollen glands in the groin area.  Yellow skin and eyes (jaundice). This is seen with hepatitis. DIAGNOSIS  To make a diagnosis, your caregiver may:  Take a medical history.  Perform a physical exam.  Take a specimen (culture) to be examined.  Examine a sample of discharge under a microscope.  Perform blood test TREATMENT   Chlamydia, gonorrhea, trichomonas, and syphilis can be cured with antibiotic medicine.  Genital herpes, hepatitis, and HIV can be treated, but not cured, with prescribed medicines. The medicines will lessen the symptoms.  Genital warts from HPV can be treated with medicine or by freezing, burning (electrocautery), or surgery. Warts may come back.  HPV is a virus and cannot be cured with medicine or surgery.However, abnormal areas may be followed very closely by your caregiver and may be removed from the cervix, vagina, or vulva through office procedures or surgery. If your diagnosis is confirmed, your recent sexual partners need treatment. This is true even if they are symptom-free or have a negative culture or evaluation. They should not have sex until their caregiver says it is okay. HOME CARE INSTRUCTIONS  All sexual partners should be informed, tested, and treated for all STDs.  Take your antibiotics as directed. Finish them even if you start to feel better.  Only take over-the-counter or prescription medicines for pain, discomfort, or fever as directed by your caregiver.  Rest.  Eat a balanced diet and drink enough fluids to keep your urine clear or pale yellow.  Do not have sex until treatment is completed and you have followed up with your caregiver. STDs should be checked after treatment.  Keep all follow-up appointments, Pap tests, and blood tests as directed by your caregiver.  Only use latex condoms and water-soluble lubricants during sexual activity. Do not use  petroleum jelly or oils.  Avoid alcohol and illegal drugs.  Get vaccinated for HPV and hepatitis. If you have not received these vaccines in the past, talk to your caregiver about whether one or both might be right for you.  Avoid risky sex practices that can break the skin. The only way to avoid getting an STD is to avoid all sexual activity.Latex condoms and dental dams (for oral sex) will help lessen the risk of getting an STD, but will not completely eliminate the risk. SEEK MEDICAL CARE IF:   You have a fever.  You have any new or worsening symptoms. Document Released: 10/08/2002 Document Revised: 10/10/2011 Document Reviewed: 10/15/2010 ExitCare Patient Information 2013 [employer].    Domestic Abuse You are being battered or abused if someone close to you hits, pushes, or physically hurts you in any way. You also are being abused if you are forced into activities. You are being sexually abused if you are forced to have sexual contact of any kind. You are being emotionally abused if you are made to feel worthless or if you are constantly threatened. It is important to remember that help is available. No one has the right to abuse you. PREVENTION OF FURTHER   ABUSE  Learn the warning signs of danger. This varies with situations but may include: the use of alcohol, threats, isolation from friends and family, or forced sexual contact. Leave if you feel that violence is going to occur.  If you are attacked or beaten, report it to the police so the abuse is documented. You do not have to press charges. The police can protect you while you or the attackers are leaving. Get the officer's name and badge number and a copy of the report.  Find someone you can trust and tell them what is happening to you: your caregiver, a nurse, clergy member, close friend or family member. Feeling ashamed is natural, but remember that you have done nothing wrong. No one deserves abuse. Document Released:  07/15/2000 Document Revised: 10/10/2011 Document Reviewed: 09/23/2010 ExitCare Patient Information 2013 [employer].    How Much is Too Much Alcohol? Drinking too much alcohol can cause injury, accidents, and health problems. These types of problems can include:   Car crashes.  Falls.  Family fighting (domestic violence).  Drowning.  Fights.  Injuries.  Burns.  Damage to certain organs.  Having a baby with birth defects. ONE DRINK CAN BE TOO MUCH WHEN YOU ARE:  Working.  Pregnant or breastfeeding.  Taking medicines. Ask your doctor.  Driving or planning to drive. If you or someone you know has a drinking problem, get help from a doctor.  Document Released: 05/14/2009 Document Revised: 10/10/2011 Document Reviewed: 05/14/2009 ExitCare Patient Information 2013 [employer].   Smoking Hazards Smoking cigarettes is extremely bad for your health. Tobacco smoke has over 200 known poisons in it. There are over 60 chemicals in tobacco smoke that cause cancer. Some of the chemicals found in cigarette smoke include:   Cyanide.  Benzene.  Formaldehyde.  Methanol (wood alcohol).  Acetylene (fuel used in welding torches).  Ammonia. Cigarette smoke also contains the poisonous gases nitrogen oxide and carbon monoxide.  Cigarette smokers have an increased risk of many serious medical problems and Smoking causes approximately:  90% of all lung cancer deaths in men.  80% of all lung cancer deaths in women.  90% of deaths from chronic obstructive lung disease. Compared with nonsmokers, smoking increases the risk of:  Coronary heart disease by 2 to 4 times.  Stroke by 2 to 4 times.  Men developing lung cancer by 23 times.  Women developing lung cancer by 13 times.  Dying from chronic obstructive lung diseases by 12 times.  . Smoking is the most preventable cause of death and disease in our society.  WHY IS SMOKING ADDICTIVE?  Nicotine is the chemical  agent in tobacco that is capable of causing addiction or dependence.  When you smoke and inhale, nicotine is absorbed rapidly into the bloodstream through your lungs. Nicotine absorbed through the lungs is capable of creating a powerful addiction. Both inhaled and non-inhaled nicotine may be addictive.  Addiction studies of cigarettes and spit tobacco show that addiction to nicotine occurs mainly during the teen years, when young people begin using tobacco products. WHAT ARE THE BENEFITS OF QUITTING?  There are many health benefits to quitting smoking.   Likelihood of developing cancer and heart disease decreases. Health improvements are seen almost immediately.  Blood pressure, pulse rate, and breathing patterns start returning to normal soon after quitting. QUITTING SMOKING   American Lung Association - 1-800-LUNGUSA  American Cancer Society - 1-800-ACS-2345 Document Released: 08/25/2004 Document Revised: 10/10/2011 Document Reviewed: 04/29/2009 ExitCare Patient Information 2013 ExitCare[employer]   LL[employer].   Stress Management Stress is a state of physical or mental tension that often results from changes in your life or normal routine. Some common causes of stress are:  Death of a loved one.  Injuries or severe illnesses.  Getting fired or changing jobs.  Moving into a new home. Other causes may be:  Sexual problems.  Business or financial losses.  Taking on a large debt.  Regular conflict with someone at home or at work.  Constant tiredness from lack of sleep. It is not just bad things that are stressful. It may be stressful to:  Win the lottery.  Get married.  Buy a new car. The amount of stress that can be easily tolerated varies from person to person. Changes generally cause stress, regardless of the types of change. Too much stress can affect your health. It may lead to physical or emotional problems. Too little stress (boredom) may also become stressful. SUGGESTIONS TO  REDUCE STRESS:  Talk things over with your family and friends. It often is helpful to share your concerns and worries. If you feel your problem is serious, you may want to get help from a professional counselor.  Consider your problems one at a time instead of lumping them all together. Trying to take care of everything at once may seem impossible. List all the things you need to do and then start with the most important one. Set a goal to accomplish 2 or 3 things each day. If you expect to do too many in a single day you will naturally fail, causing you to feel even more stressed.  Do not use alcohol or drugs to relieve stress. Although you may feel better for a short time, they do not remove the problems that caused the stress. They can also be habit forming.  Exercise regularly - at least 3 times per week. Physical exercise can help to relieve that "uptight" feeling and will relax you.  The shortest distance between despair and hope is often a good night's sleep.  Go to bed and get up on time allowing yourself time for appointments without being rushed.  Take a short "time-out" period from any stressful situation that occurs during the day. Close your eyes and take some deep breaths. Starting with the muscles in your face, tense them, hold it for a few seconds, then relax. Repeat this with the muscles in your neck, shoulders, hand, stomach, back and legs.  Take good care of yourself. Eat a balanced diet and get plenty of rest.  Schedule time for having fun. Take a break from your daily routine to relax. HOME CARE INSTRUCTIONS   Call if you feel overwhelmed by your problems and feel you can no longer manage them on your own.  Return immediately if you feel like hurting yourself or someone else. Document Released: 01/11/2001 Document Revised: 10/10/2011 Document Reviewed: 09/03/2007 ExitCare Patient Information 2013 [employer].   

## 2013-02-12 LAB — COMPREHENSIVE METABOLIC PANEL
ALT: 26 U/L (ref 0–35)
AST: 21 U/L (ref 0–37)
Albumin: 4.1 g/dL (ref 3.5–5.2)
BUN: 11 mg/dL (ref 6–23)
Creat: 0.74 mg/dL (ref 0.50–1.10)
Potassium: 4 mEq/L (ref 3.5–5.3)
Total Bilirubin: 0.3 mg/dL (ref 0.3–1.2)
Total Protein: 6.4 g/dL (ref 6.0–8.3)

## 2013-02-12 LAB — LIPID PANEL
HDL: 41 mg/dL (ref >39)
LDL Cholesterol: 121 mg/dL — ABNORMAL HIGH (ref 0–99)
Total CHOL/HDL Ratio: 4.9 Ratio

## 2013-02-12 LAB — HEMOGLOBIN A1C: Hgb A1c MFr Bld: 5.3 % (ref <5.7)

## 2013-05-15 ENCOUNTER — Ambulatory Visit: Payer: BC Managed Care – PPO | Admitting: Physician Assistant

## 2013-05-15 ENCOUNTER — Ambulatory Visit (INDEPENDENT_AMBULATORY_CARE_PROVIDER_SITE_OTHER): Payer: BC Managed Care – PPO | Admitting: Family Medicine

## 2013-05-15 VITALS — BP 120/70 | HR 104 | Temp 99.0°F | Resp 18 | Ht 67.0 in | Wt 311.0 lb

## 2013-05-15 DIAGNOSIS — J029 Acute pharyngitis, unspecified: Secondary | ICD-10-CM

## 2013-05-15 DIAGNOSIS — R0982 Postnasal drip: Secondary | ICD-10-CM

## 2013-05-15 DIAGNOSIS — J302 Other seasonal allergic rhinitis: Secondary | ICD-10-CM

## 2013-05-15 DIAGNOSIS — J309 Allergic rhinitis, unspecified: Secondary | ICD-10-CM

## 2013-05-15 DIAGNOSIS — J019 Acute sinusitis, unspecified: Secondary | ICD-10-CM

## 2013-05-15 LAB — POCT RAPID STREP A (OFFICE): Rapid Strep A Screen: NEGATIVE

## 2013-05-15 MED ORDER — MOMETASONE FUROATE 50 MCG/ACT NA SUSP: 2.0000 | NASAL | Status: DC | PRN

## 2013-05-15 MED ORDER — AMOXICILLIN-POT CLAVULANATE 875-125 MG PO TABS: 1.0000 | ORAL_TABLET | Freq: Two times a day (BID) | ORAL | Status: DC

## 2013-05-15 NOTE — Patient Instructions (Signed)

## 2013-05-15 NOTE — Progress Notes (Signed)
[facility]:  Office Visit  Chief Complaint:  Chief Complaint  Patient presents with  . Sore Throat    x yesterday    HPI: Jessica Castro is a 27 y.o. female who is here for  1 day history of sore throat, 2 weeks ago she had the exact same thing She feels "yucky' in general. No fevers, chills, her head feels swollen in sinuses, no runny nose, no ear pain, no cough. + swollen glands.  Still has tonsils. No CP, SOB, wheezing.  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 Asthma   . History of chicken pox     childhood  . Depression     treated with anxiety- current  counseling   . History of bronchitis   . History of headache   . GERD (gastroesophageal reflux disease)   . Allergic rhinitis     Allergy- shots with Dr Sharyn Lull  . History of migraines   . Herniated disc     back  . Menorrhagia   . Anxiety     has a history of panic disorder  . Amenorrhea    Past Surgical History  Procedure Laterality Date  . Wisdom tooth extraction  2003   History   Social History  . Marital Status: Single    Spouse Name: N/A    Number of Children: N/A  . Years of Education: N/A   Social History Main Topics  . Smoking status: Never Smoker   . Smokeless tobacco: Never Used  . Alcohol Use: 0 - 1 oz/week    0-2 drink(s) per week  . Drug Use: No  . Sexual Activity: Not Currently    Partners: Male    Birth Control/ Protection: Inserts   Other Topics Concern  . None   Social History Narrative  . None   Family History  Problem Relation Age of Onset  . Alcohol abuse Mother   . Hyperlipidemia Mother   . Hyperlipidemia Father   . Diabetes Father   . Hyperlipidemia Maternal Grandmother   . Alzheimer's disease Maternal Grandmother   . Pancreatic cancer Paternal Grandfather   . Stroke Maternal Grandfather   . Alcohol abuse Maternal Grandfather    No Known Allergies Prior to Admission medications   Medication Sig Start Date  End Date Taking? Authorizing Provider  cetirizine (ZYRTEC) 10 MG tablet Take 10 mg by mouth daily.     Yes Historical Provider, MD  clonazePAM (KLONOPIN) 0.5 MG tablet Take 1/2 tablet twice a week as needed .    Yes Historical Provider, MD  etonogestrel-ethinyl estradiol (NUVARING) 0.12-0.015 MG/24HR vaginal ring Place 1 each vaginally once. Insert vaginally and leave in place for 3 consecutive weeks, then remove for 1 week. 02/11/13  Yes Verner Chol, CNM  montelukast (SINGULAIR) 10 MG tablet Take 1 tablet (10 mg total) by mouth at bedtime. 08/04/12  Yes Heather M Marte, PA-C  OMEPRAZOLE PO Take 1 tablet by mouth daily.     Yes Historical Provider, MD  Sertraline HCl (ZOLOFT PO) Take 1 tablet by mouth daily.    Yes Historical Provider, MD  albuterol (PROVENTIL HFA;VENTOLIN HFA) 108 (90 BASE) MCG/ACT inhaler Inhale 2 puffs into the lungs every 6 (six) hours as needed.      Historical Provider, MD  mometasone (NASONEX) 50 MCG/ACT nasal spray Place 2 sprays into the nose as needed.     Historical Provider, MD  ROS: The patient denies fevers, chills, night sweats, unintentional weight loss, chest pain, palpitations, wheezing, dyspnea on exertion, nausea, vomiting, abdominal pain, dysuria, hematuria, melena, numbness, weakness, or tingling.  All other systems have been reviewed and were otherwise negative with the exception of those mentioned in the HPI and as above.    PHYSICAL EXAM: Filed Vitals:   05/15/13 1146  BP: 120/70  Pulse: 104  Temp: 99 F (37.2 C)  Resp: 18   Filed Vitals:   05/15/13 1146  Height: 5\' 7"  (1.702 m)  Weight: 311 lb (141.069 kg)   Body mass index is 48.7 kg/(m^2).  General: Alert, no acute distress HEENT:  Normocephalic, atraumatic, oropharynx patent. EOMI, PERRLA. + exudates, TM nl. + siinus tenderness Cardiovascular:  Regular rate and rhythm, no rubs murmurs or gallops.  No Carotid bruits, radial pulse intact. No pedal edema.  Respiratory: Clear to  auscultation bilaterally.  No wheezes, rales, or rhonchi.  No cyanosis, no use of accessory musculature GI: No organomegaly, abdomen is soft and non-tender, positive bowel sounds.  No masses. Skin: No rashes. Neurologic: Facial musculature symmetric. Psychiatric: Patient is appropriate throughout our interaction. Lymphatic: No cervical lymphadenopathy Musculoskeletal: Gait intact.   LABS: Results for orders placed in visit on 05/15/13  CULTURE, GROUP A STREP      Result Value Range   Organism ID, Bacteria Normal Upper Respiratory Flora     Organism ID, Bacteria No Beta Hemolytic Streptococci Isolated    POCT RAPID STREP A (OFFICE)      Result Value Range   Rapid Strep A Screen Negative  Negative     EKG/XRAY:   Primary read interpreted by Dr. Conley Rolls at [facility].   ASSESSMENT/PLAN: Encounter Diagnoses  Name Primary?  . Acute pharyngitis Yes  . Acute sinusitis   . PND (post-nasal drip)   . Seasonal allergies    Rx Agumentin and nasonex Throat cx pending F/u prn Gross sideeffects, risk and benefits, and alternatives of medications d/w patient. Patient is aware that all medications have potential sideeffects and we are unable to predict every sideeffect or drug-drug interaction that may occur.  Hamilton Capri PHUONG, DO 05/18/2013 5:19 PM

## 2013-05-18 LAB — CULTURE, GROUP A STREP: Organism ID, Bacteria: NORMAL

## 2013-05-22 ENCOUNTER — Telehealth: Payer: Self-pay

## 2013-05-22 MED ORDER — FLUCONAZOLE 150 MG PO TABS: 150.0000 mg | ORAL_TABLET | Freq: Once | ORAL | Status: DC

## 2013-05-22 NOTE — Telephone Encounter (Signed)
Pt states after taking antibiotic she had developed yeast infection,would Like Rx called to pharmacy   Best phone [phone]   Pharmacy [facility] in [facility]

## 2013-06-25 ENCOUNTER — Telehealth: Payer: Self-pay

## 2013-06-25 NOTE — Telephone Encounter (Signed)
fluconazole (DIFLUCAN) 150 MG tablet Request for refill - thinks she threw one away. States the yeast infection has not cleared up.   [facility] - [address] [place]   [phone]

## 2013-06-25 NOTE — Telephone Encounter (Signed)
Office visit or refill? Took antibiotics in October developed yeast. Still not clear. Please advise.

## 2013-06-25 NOTE — Telephone Encounter (Signed)
RTC.  Was treated for yeast infection over 1 month ago

## 2013-06-26 ENCOUNTER — Ambulatory Visit (INDEPENDENT_AMBULATORY_CARE_PROVIDER_SITE_OTHER): Payer: BC Managed Care – PPO | Admitting: Certified Nurse Midwife

## 2013-06-26 ENCOUNTER — Encounter: Payer: Self-pay | Admitting: Certified Nurse Midwife

## 2013-06-26 VITALS — BP 124/90 | HR 82 | Resp 18 | Ht 67.0 in | Wt 316.0 lb

## 2013-06-26 DIAGNOSIS — L03039 Cellulitis of unspecified toe: Secondary | ICD-10-CM

## 2013-06-26 DIAGNOSIS — L03031 Cellulitis of right toe: Secondary | ICD-10-CM

## 2013-06-26 DIAGNOSIS — N760 Acute vaginitis: Secondary | ICD-10-CM

## 2013-06-26 DIAGNOSIS — B373 Candidiasis of vulva and vagina: Secondary | ICD-10-CM

## 2013-06-26 MED ORDER — FLUCONAZOLE 150 MG PO TABS: ORAL_TABLET | ORAL | Status: DC

## 2013-06-26 MED ORDER — METRONIDAZOLE 0.75 % VA GEL: VAGINAL | Status: DC

## 2013-06-26 NOTE — Progress Notes (Signed)
27 y.o.SingleCaucasian female [id number] with a 30 day(s) history of the following:discharge described as malodorous and grey and local irritation Sexually active: yes Last sexual activity:7days ago. No STD concerns or testing needed. Pt also reports the following associated symptoms: none Patient has not tried over the counter treatment. Denies any new personal products, but has been on antibiotics for sinus infection, completed approximately a week ago. Also complaining on red great toe on right . Removed polish and cleaned nail, now some redness around base. Not painful, no fever or chills. No pus from area. No other issues.  O: Healthy female, WDWN obese Affect: normal, orientation x 3 Contraception: Nuva RIng    Exam:  ZOX:WRUEAV, Bartholin's, Urethra, Skene's normal                WUJ:[id number]: watery, malodorous and grey/ white in color, pH 5.0, wet prep done                Cx:  normal appearance and non tender  Right great NFA:[id number] red at corner on base, non tender with no pus noted. Pedal pulse normal. No discoloration of foot or toe noted.                  Wet Prep shows:positive for yeast and BV, negative for trich   A: BV Yeast vaginitis Right great toe, slight red in corner of base of toe, non tender to touch, no pus noted. Pedal pulse normal.   P:Reviewed findings of BV and yeast. Rx Metrogel see order Rx Diflucan see order, aveeno sitz bath prn comfort Instructed to soak toe in epsom salt bath tid, apply Bactracin OTC to area. If redness increase or pain need PCP or urgent care. Do no pick at toe with sharp instruments. Patient aware of warning signs and symptoms.

## 2013-06-26 NOTE — Telephone Encounter (Signed)
Thanks. Called to advise, left message for her to call back.

## 2013-06-26 NOTE — Telephone Encounter (Signed)
Spoke to her to advise.  

## 2013-06-26 NOTE — Progress Notes (Signed)
Note reviewed, agree with plan.  Aariah Godette, MD  

## 2013-07-01 ENCOUNTER — Encounter: Payer: Self-pay | Admitting: Physician Assistant

## 2013-07-01 ENCOUNTER — Ambulatory Visit (INDEPENDENT_AMBULATORY_CARE_PROVIDER_SITE_OTHER): Payer: BC Managed Care – PPO | Admitting: Physician Assistant

## 2013-07-01 VITALS — BP 128/82 | HR 96 | Temp 97.9°F | Resp 18 | Ht 67.0 in | Wt 317.5 lb

## 2013-07-01 DIAGNOSIS — L03039 Cellulitis of unspecified toe: Secondary | ICD-10-CM

## 2013-07-01 DIAGNOSIS — L03031 Cellulitis of right toe: Secondary | ICD-10-CM | POA: Insufficient documentation

## 2013-07-01 DIAGNOSIS — J329 Chronic sinusitis, unspecified: Secondary | ICD-10-CM | POA: Insufficient documentation

## 2013-07-01 DIAGNOSIS — Z9109 Other allergy status, other than to drugs and biological substances: Secondary | ICD-10-CM

## 2013-07-01 MED ORDER — FLUTICASONE PROPIONATE 50 MCG/ACT NA SUSP: 2.0000 | Freq: Every day | NASAL | Status: DC

## 2013-07-01 NOTE — Progress Notes (Signed)
Pre visit review using our clinic review tool, if applicable. No additional management support is needed unless otherwise documented below in the visit note/SLS  

## 2013-07-01 NOTE — Progress Notes (Signed)
Patient ID: Jessica Castro, female   DOB: January 18, 1986, 27 y.o.   MRN: [id number]  Patient presents to clinic today c/o nasal congestion, sinus pressure with post-nasal drainage and cough occasionally productive of clear sputum x 1-2 days.  Patient denies fever, chills, muscle aches.  Denies shortness of breath or wheezing.  Patient endorses history of seasonal allergies for which she takes both singulair and zyrtec daily.  Has old prescription for Nasonex but has not taken.  Patient denies recent travel.  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  Patient also c/o of erythema around big toe of R foot associated with recent ingrown toenail.  Patient was seen at [facility] for this issue and was told to soak foot in Epson salt and apply topical Bacitracin ointment to affected area.  Patient states that area has improved considerably, but there is some mild lingering redness around toe-nail.  Denies fever, chills, tenderness.  Patient denies thickened or yellowed nail.  Denies purulent drainage.  Past Medical History  Diagnosis Date  . Asthma   . History of chicken pox     childhood  . Depression     treated with anxiety- current  counseling   . History of bronchitis   . History of headache   . GERD (gastroesophageal reflux disease)   . Allergic rhinitis     Allergy- shots with Dr Sharyn Lull  . History of migraines   . Herniated disc     back  . Menorrhagia   . Anxiety     has a history of panic disorder  . Amenorrhea     Current Outpatient Prescriptions on File Prior to Visit  Medication Sig Dispense Refill  . cetirizine (ZYRTEC) 10 MG tablet Take 10 mg by mouth daily.        . clonazePAM (KLONOPIN) 0.5 MG tablet Take 1/2 tablet twice a week as needed .       . etonogestrel-ethinyl estradiol (NUVARING) 0.12-0.015 MG/24HR vaginal ring Place 1 each vaginally once. Insert vaginally and leave in place for 3 consecutive weeks, then remove for 1 week.  1 each  12  . fluconazole (DIFLUCAN) 150  MG tablet Take one today and repeat one in 5 days  2 tablet  0  . metroNIDAZOLE (METROGEL) 0.75 % vaginal gel One applicator every night x 5  70 g  0  . montelukast (SINGULAIR) 10 MG tablet Take 1 tablet (10 mg total) by mouth at bedtime.  30 tablet  5  . OMEPRAZOLE PO Take 1 tablet by mouth daily.        . Sertraline HCl (ZOLOFT PO) Take 1 tablet by mouth daily.        No current facility-administered medications on file prior to visit.    No Known Allergies  Family History  Problem Relation Age of Onset  . Alcohol abuse Mother   . Hyperlipidemia Mother   . Hyperlipidemia Father   . Diabetes Father   . Hyperlipidemia Maternal Grandmother   . Alzheimer's disease Maternal Grandmother   . Pancreatic cancer Paternal Grandfather   . Stroke Maternal Grandfather   . Alcohol abuse Maternal Grandfather     History   Social History  . Marital Status: Single    Spouse Name: N/A    Number of Children: N/A  . Years of Education: N/A   Social History Main Topics  . Smoking status: Never Smoker   . Smokeless tobacco: Never Used  . Alcohol Use: 0 - 1 oz/week  0-2 drink(s) per week  . Drug Use: No  . Sexual Activity: Not Currently    Partners: Male    Birth Control/ Protection: Inserts   Other Topics Concern  . None   Social History Narrative  . None   Review of Systems - See HPI.  All other ROS are negative.  Filed Vitals:   07/01/13 1312  BP: 128/82  Pulse: 96  Temp: 97.9 F (36.6 C)  Resp: 18   Physical Exam  Vitals reviewed. Constitutional: She is oriented to person, place, and time and well-developed, well-nourished, and in no distress.  HENT:  Head: Normocephalic and atraumatic.  Right Ear: External ear normal.  Left Ear: External ear normal.  Nose: Nose normal.  Mouth/Throat: Oropharynx is clear and moist. No oropharyngeal exudate.  Tympanic membranes within normal limits bilaterally.  No tenderness to percussion of sinuses noted on exam.  Eyes:  Conjunctivae and EOM are normal. Pupils are equal, round, and reactive to light.  Neck: Neck supple.  Cardiovascular: Normal rate, regular rhythm, normal heart sounds and intact distal pulses.   Pulmonary/Chest: Effort normal and breath sounds normal. No respiratory distress. She has no wheezes. She has no rales. She exhibits no tenderness.  Lymphadenopathy:    She has no cervical adenopathy.  Neurological: She is alert and oriented to person, place, and time.  Skin:  Faint erythema of medial aspect of right toenail, without warmth, tenderness or drainage.    Psychiatric: Affect normal.   Recent Results (from the past 2160 hour(s))  POCT RAPID STREP A (OFFICE)     Status: None   Collection Time    05/15/13 12:40 PM      Result Value Range   Rapid Strep A Screen Negative  Negative  CULTURE, GROUP A STREP     Status: None   Collection Time    05/15/13 12:55 PM      Result Value Range   Organism ID, Bacteria Normal Upper Respiratory Flora     Organism ID, Bacteria No Beta Hemolytic Streptococci Isolated     Assessment/Plan: Sinusitis Allergic vs. Viral etiology.  Increase fluids.  Rest.  Saline nasal spray. Rx Flonase.  Continue Singulair and Zyrtec.  Humidifier in room.  Paronychia of toe of right foot Resolving.  Continue topical antibiotic and Epson soaks.  Return if symptoms do not continue to improve.

## 2013-07-01 NOTE — Patient Instructions (Signed)
Please increase fluid intake.  Take a daily probiotic and multivitamin.  Rest.  Continue zyrtec and singulair.  Use Flonase once daily.  Please call if symptoms persist or acutely worsen.  If you are dealing with recurrent symptoms, you should be referred to see an ENT specialist.

## 2013-07-01 NOTE — Assessment & Plan Note (Signed)
Resolving.  Continue topical antibiotic and Epson soaks.  Return if symptoms do not continue to improve.

## 2013-07-01 NOTE — Assessment & Plan Note (Signed)
Allergic vs. Viral etiology.  Increase fluids.  Rest.  Saline nasal spray. Rx Flonase.  Continue Singulair and Zyrtec.  Humidifier in room.

## 2013-07-04 ENCOUNTER — Telehealth: Payer: Self-pay | Admitting: Physician Assistant

## 2013-07-04 DIAGNOSIS — J329 Chronic sinusitis, unspecified: Secondary | ICD-10-CM

## 2013-07-04 MED ORDER — AZITHROMYCIN 250 MG PO TABS: ORAL_TABLET | ORAL | Status: DC

## 2013-07-04 NOTE — Telephone Encounter (Signed)
Discussed worsening symptoms with patient.  Sent in Rx for azithromycin to take in addition to other instructions given at office visit.

## 2013-07-04 NOTE — Telephone Encounter (Signed)
Patient states that she was seen earlier this week and is not feeling any better. Patient says that she is feeling worse and would like an antiobiotic called in.

## 2013-07-04 NOTE — Telephone Encounter (Signed)
Please Advise/SLS  

## 2013-07-04 NOTE — Addendum Note (Signed)
Addended by: Marcelline Mates on: 07/04/2013 05:16 PM   Modules accepted: Orders

## 2013-07-09 ENCOUNTER — Telehealth: Payer: Self-pay | Admitting: *Deleted

## 2013-07-09 NOTE — Telephone Encounter (Signed)
Pt able to reschedule on Thursday

## 2013-07-10 ENCOUNTER — Ambulatory Visit: Payer: BC Managed Care – PPO | Admitting: Internal Medicine

## 2013-07-11 ENCOUNTER — Ambulatory Visit (INDEPENDENT_AMBULATORY_CARE_PROVIDER_SITE_OTHER): Payer: BC Managed Care – PPO | Admitting: Internal Medicine

## 2013-07-11 ENCOUNTER — Encounter: Payer: Self-pay | Admitting: Internal Medicine

## 2013-07-11 VITALS — BP 128/79 | HR 106 | Temp 98.6°F | Resp 20 | Wt 320.0 lb

## 2013-07-11 DIAGNOSIS — L600 Ingrowing nail: Secondary | ICD-10-CM

## 2013-07-11 DIAGNOSIS — Z23 Encounter for immunization: Secondary | ICD-10-CM

## 2013-07-11 DIAGNOSIS — F32A Depression, unspecified: Secondary | ICD-10-CM

## 2013-07-11 DIAGNOSIS — E669 Obesity, unspecified: Secondary | ICD-10-CM

## 2013-07-11 DIAGNOSIS — M5126 Other intervertebral disc displacement, lumbar region: Secondary | ICD-10-CM

## 2013-07-11 DIAGNOSIS — K219 Gastro-esophageal reflux disease without esophagitis: Secondary | ICD-10-CM

## 2013-07-11 DIAGNOSIS — F329 Major depressive disorder, single episode, unspecified: Secondary | ICD-10-CM

## 2013-07-11 DIAGNOSIS — F341 Dysthymic disorder: Secondary | ICD-10-CM

## 2013-07-11 MED ORDER — FLUCONAZOLE 150 MG PO TABS: ORAL_TABLET | ORAL | Status: DC

## 2013-07-11 NOTE — Progress Notes (Signed)
Subjective:    Patient ID: Jessica Castro, female    DOB: Apr 03, 1986, 27 y.o.   MRN: [id number]  HPI Jessica Castro is a new pt here for first visit for primary care.    I see her mother Jessica Castro.  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    PMH of allergic rhinitis  ( has seen Dr. Willa Rough allergist in the past),  HNP lumbar 2007 and 2011,  Mixed anxiety/depression  (on Zoloft and Klonopin  [employer]  [facility]/  Dr. Alanson Aly),  GERD (omeprazole),  Migraine headache ( treated with Ibuprofen).  She is concerned over three issues today  She believes she may have a vaginal yeast infection.  Recently treated with a Z-pak  (12/4 by PA at [facility])   And now has itchy vaginal  discharge.  Diflucan has worked for her in the past.  NO pelvic pain  She is gaining weight and has tried multiple therapies to try to lose weight.  - diet,  Weight watchers etc,  Has not helped.  Her BMI places her in the morbidly obese category.  She reports at least a 3 week presence of  Inability to heal her R great toe due to ingrown toenail that she tried to "dig out herself".  She was seen by PA on 12/1 and treated with topicals.  On 12/4 she did get a Z-pak . R great toe still draining. No fever  No Known Allergies Past Medical History  Diagnosis Date  . Asthma   . History of chicken pox     childhood  . Depression     treated with anxiety- current  counseling   . History of bronchitis   . History of headache   . GERD (gastroesophageal reflux disease)   . Allergic rhinitis     Allergy- shots with Dr Sharyn Lull  . History of migraines   . Herniated disc     back  . Menorrhagia   . Anxiety     has a history of panic disorder  . Amenorrhea    Past Surgical History  Procedure Laterality Date  . Wisdom tooth extraction  2003   History   Social History  . Marital Status: Single    Spouse Name: N/A    Number of Children: N/A  . Years of Education: N/A   Occupational History  . Not on file.   Social History  Main Topics  . Smoking status: Never Smoker   . Smokeless tobacco: Never Used  . Alcohol Use: 0 - 1 oz/week    0-2 drink(s) per week     Comment: social  . Drug Use: No  . Sexual Activity: Not Currently    Partners: Male    Birth Control/ Protection: Inserts   Other Topics Concern  . Not on file   Social History Narrative  . No narrative on file   Family History  Problem Relation Age of Onset  . Alcohol abuse Mother   . Hyperlipidemia Mother   . Hyperlipidemia Father   . Diabetes Father   . Hyperlipidemia Maternal Grandmother   . Alzheimer's disease Maternal Grandmother   . Pancreatic cancer Paternal Grandfather   . Stroke Maternal Grandfather   . Alcohol abuse Maternal Grandfather    Patient Active Problem List   Diagnosis Date Noted  . Anxiety and depression 07/14/2013  . GERD (gastroesophageal reflux disease) 07/14/2013  . Morbid obesity 07/14/2013  . Herniated lumbar disc without myelopathy 07/14/2013  . Sinusitis 07/01/2013  .  Paronychia of toe of right foot 07/01/2013  . Urinary frequency 05/09/2012  . Hemorrhoids 06/02/2011  . Constipation 03/21/2011  . Lumbar back pain 03/21/2011  . Asthma 03/21/2011  . Allergic rhinitis 03/21/2011  . Migraine 03/21/2011   Current Outpatient Prescriptions on File Prior to Visit  Medication Sig Dispense Refill  . cetirizine (ZYRTEC) 10 MG tablet Take 10 mg by mouth daily.        . clonazePAM (KLONOPIN) 0.5 MG tablet Take 1/2 tablet twice a week as needed .       . etonogestrel-ethinyl estradiol (NUVARING) 0.12-0.015 MG/24HR vaginal ring Place 1 each vaginally once. Insert vaginally and leave in place for 3 consecutive weeks, then remove for 1 week.  1 each  12  . fluticasone (FLONASE) 50 MCG/ACT nasal spray Place 2 sprays into both nostrils daily.  16 g  6  . montelukast (SINGULAIR) 10 MG tablet Take 1 tablet (10 mg total) by mouth at bedtime.  30 tablet  5  . OMEPRAZOLE PO Take 1 tablet by mouth daily.        . Sertraline  HCl (ZOLOFT PO) Take 50 mg by mouth daily.        No current facility-administered medications on file prior to visit.        Review of Systems    see HPI Objective:   Physical Exam Physical Exam  Nursing note and vitals reviewed.  Constitutional: She is oriented to person, place, and time. She appears well-developed and well-nourished.  HENT:  Head: Normocephalic and atraumatic.  Cardiovascular: Normal rate and regular rhythm. Exam reveals no gallop and no friction rub.  No murmur heard.  Pulmonary/Chest: Breath sounds normal. She has no wheezes. She has no rales.  Neurological: She is alert and oriented to person, place, and time.  Skin: Skin is warm and dry.  M/S  R great toe  Serous drainage edge of nailbed.  No purulent drainage.    NO surrounding cellulitis Psychiatric: She has a normal mood and affect. Her behavior is normal.         Assessment & Plan:  R great toe paronychia    Recently completed a course  of Z-pak.  No worrisome cellulitis on exam.   Will refer to podiatry as she likely need nail excision  Probable yeast vaginitis  Will give 3 day course of Diflucan.  Advised if not better she is to see her GYN  MD  GERD continue Omeprazole  Morbid obesity : has failed dietary and WW trial  Will refer to Dr. Kinnie Scales  -  She may be a candidate for bariatric surgery  Mixed anxiety/depression  Treated by PA with Dr. Alanson Aly  [facility]    Allergic rhinitis continue meds  Lumbar HNP  2007, 2011  Will give influenza vaccine today

## 2013-07-11 NOTE — Patient Instructions (Signed)
Referral to Dr. Raynald Kemp podiatry  Toenail  Wound  Referral  Dr. Kinnie Scales  Obesity  Labs today   See me as needed

## 2013-07-14 DIAGNOSIS — M5126 Other intervertebral disc displacement, lumbar region: Secondary | ICD-10-CM | POA: Insufficient documentation

## 2013-07-14 DIAGNOSIS — F329 Major depressive disorder, single episode, unspecified: Secondary | ICD-10-CM | POA: Insufficient documentation

## 2013-07-14 DIAGNOSIS — F32A Depression, unspecified: Secondary | ICD-10-CM | POA: Insufficient documentation

## 2013-07-14 DIAGNOSIS — K219 Gastro-esophageal reflux disease without esophagitis: Secondary | ICD-10-CM | POA: Insufficient documentation

## 2013-07-15 ENCOUNTER — Encounter: Payer: Self-pay | Admitting: *Deleted

## 2013-07-19 ENCOUNTER — Ambulatory Visit: Payer: BC Managed Care – PPO | Admitting: Podiatry

## 2013-07-23 ENCOUNTER — Ambulatory Visit (INDEPENDENT_AMBULATORY_CARE_PROVIDER_SITE_OTHER): Payer: BC Managed Care – PPO | Admitting: Podiatry

## 2013-07-23 ENCOUNTER — Encounter: Payer: Self-pay | Admitting: Podiatry

## 2013-07-23 VITALS — BP 122/88 | HR 98

## 2013-07-23 DIAGNOSIS — L03031 Cellulitis of right toe: Secondary | ICD-10-CM

## 2013-07-23 DIAGNOSIS — L03039 Cellulitis of unspecified toe: Secondary | ICD-10-CM

## 2013-07-23 DIAGNOSIS — L600 Ingrowing nail: Secondary | ICD-10-CM

## 2013-07-23 NOTE — Progress Notes (Signed)
27 year old female presents with painful ingrown right great toe nail. Was seen by urgent care physician and was put on antibiotics 6 weeks ago. The area is still tender and red.  Objective: Ingrown inflamed nail on both border right hallus. Pus draining medial border.  Neurovascular status are within normal. Rectus foot without gross deformity.  Assessment: Infected ingrown nail right hallux both borders.  Plan: Right hallucal nail borders removed and matrix cauterized with phenol and alcohol under local anesthesia. Local used 50/50 mixture 0.5% marcaine plain and 1% Xylocaine with Epi. Patient tolerated well. Home care instruction given. Return in one week for follow up.

## 2013-07-23 NOTE — Patient Instructions (Signed)
Ingrown nail surgery done. Follow instruction and soak daily. Return next week for follow up.

## 2013-07-29 ENCOUNTER — Encounter: Payer: BC Managed Care – PPO | Admitting: Podiatry

## 2013-07-29 NOTE — Telephone Encounter (Signed)
Error

## 2013-08-07 ENCOUNTER — Encounter: Payer: Self-pay | Admitting: Podiatry

## 2013-08-07 ENCOUNTER — Ambulatory Visit (INDEPENDENT_AMBULATORY_CARE_PROVIDER_SITE_OTHER): Payer: BC Managed Care – PPO | Admitting: Podiatry

## 2013-08-07 DIAGNOSIS — L600 Ingrowing nail: Secondary | ICD-10-CM

## 2013-08-07 NOTE — Patient Instructions (Signed)
Post op wound right great toe nail. Healed well. No complication noted. Return as needed.

## 2013-08-07 NOTE — Progress Notes (Signed)
Post op nail right hallux. Healed well without complication. Return as needed.

## 2013-08-09 ENCOUNTER — Other Ambulatory Visit: Payer: Self-pay | Admitting: Physician Assistant

## 2013-10-02 ENCOUNTER — Other Ambulatory Visit: Payer: Self-pay | Admitting: Physician Assistant

## 2013-11-07 ENCOUNTER — Ambulatory Visit (INDEPENDENT_AMBULATORY_CARE_PROVIDER_SITE_OTHER): Payer: BC Managed Care – PPO | Admitting: Internal Medicine

## 2013-11-07 ENCOUNTER — Encounter: Payer: Self-pay | Admitting: Internal Medicine

## 2013-11-07 VITALS — BP 119/82 | HR 86 | Temp 98.1°F | Resp 18 | Ht 67.0 in | Wt 330.0 lb

## 2013-11-07 DIAGNOSIS — J45909 Unspecified asthma, uncomplicated: Secondary | ICD-10-CM

## 2013-11-07 DIAGNOSIS — L03039 Cellulitis of unspecified toe: Secondary | ICD-10-CM

## 2013-11-07 DIAGNOSIS — J309 Allergic rhinitis, unspecified: Secondary | ICD-10-CM

## 2013-11-07 DIAGNOSIS — L03031 Cellulitis of right toe: Secondary | ICD-10-CM

## 2013-11-07 DIAGNOSIS — L600 Ingrowing nail: Secondary | ICD-10-CM

## 2013-11-07 MED ORDER — ALBUTEROL SULFATE HFA 108 (90 BASE) MCG/ACT IN AERS: INHALATION_SPRAY | RESPIRATORY_TRACT | Status: DC

## 2013-11-07 NOTE — Patient Instructions (Addendum)
Schedule CPE  Dr Evelene CroonKaur psychiatris  [phone]  Dr. Emerson MonteParrish McKinney:  [phone]

## 2013-11-07 NOTE — Progress Notes (Signed)
Subjective:    Patient ID: Jessica PancoastKatie Castro, female    DOB: [birth year], 28 y.o.   MRN: [id number]  HPI  Jessica Castro is here for follow up.    Paronychia of Left great toe  Healing as expected   She is about to start an  aerobic exercise program and has exercise induced bronchospasm in the past.  She is out of her Albuterol.   NO wheezing now.  She is taking her Singulair for her allergies  She states she would like a second opinion regarding her Zoloft.  Not sure if she needs if forever and she is wondering if it is affecting her weight.  She tried to get an appt with Dr. Jennelle Humanottle but could not get appt  Also is wondering if she could go back to Loestrin that her GYN gave to her.  Allergies  Allergen Reactions  . Peanuts [Peanut Oil]    Past Medical History  Diagnosis Date  . Asthma   . History of chicken pox     childhood  . Depression     treated with anxiety- current  counseling   . History of bronchitis   . History of headache   . GERD (gastroesophageal reflux disease)   . Allergic rhinitis     Allergy- shots with Dr Sharyn LullKoslow  . History of migraines   . Herniated disc     back  . Menorrhagia   . Anxiety     has a history of panic disorder  . Amenorrhea    Past Surgical History  Procedure Laterality Date  . Wisdom tooth extraction  2003   History   Social History  . Marital Status: Single    Spouse Name: N/A    Number of Children: N/A  . Years of Education: N/A   Occupational History  . Not on file.   Social History Main Topics  . Smoking status: Never Smoker   . Smokeless tobacco: Never Used  . Alcohol Use: 0.0 - 1.0 oz/week    0-2 drink(s) per week     Comment: social  . Drug Use: No  . Sexual Activity: Not Currently    Partners: Male    Birth Control/ Protection: Inserts   Other Topics Concern  . Not on file   Social History Narrative  . No narrative on file   Family History  Problem Relation Age of Onset  . Alcohol abuse Mother   . Hyperlipidemia  Mother   . Heart disease Mother   . Hyperlipidemia Father   . Diabetes Father   . Hyperlipidemia Maternal Grandmother   . Alzheimer's disease Maternal Grandmother   . Pancreatic cancer Paternal Grandfather   . Stroke Maternal Grandfather   . Alcohol abuse Maternal Grandfather    Patient Active Problem List   Diagnosis Date Noted  . Onychocryptosis 07/23/2013  . Anxiety and depression 07/14/2013  . GERD (gastroesophageal reflux disease) 07/14/2013  . Morbid obesity 07/14/2013  . Herniated lumbar disc without myelopathy 07/14/2013  . Sinusitis 07/01/2013  . Paronychia of toe of right foot 07/01/2013  . Urinary frequency 05/09/2012  . Hemorrhoids 06/02/2011  . Constipation 03/21/2011  . Lumbar back pain 03/21/2011  . Asthma 03/21/2011  . Allergic rhinitis 03/21/2011  . Migraine 03/21/2011   Current Outpatient Prescriptions on File Prior to Visit  Medication Sig Dispense Refill  . cetirizine (ZYRTEC) 10 MG tablet Take 10 mg by mouth daily.        . clonazePAM (KLONOPIN) 0.5 MG tablet Take  1/2 tablet twice a week as needed .       . etonogestrel-ethinyl estradiol (NUVARING) 0.12-0.015 MG/24HR vaginal ring Place 1 each vaginally once. Insert vaginally and leave in place for 3 consecutive weeks, then remove for 1 week.  1 each  12  . montelukast (SINGULAIR) 10 MG tablet TAKE 1 TABLET BY MOUTH EVERY DAY  30 tablet  6  . OMEPRAZOLE PO Take 1 tablet by mouth daily.        . Sertraline HCl (ZOLOFT PO) Take 50 mg by mouth daily.       . fluconazole (DIFLUCAN) 150 MG tablet       . fluticasone (FLONASE) 50 MCG/ACT nasal spray Place 2 sprays into both nostrils daily.  16 g  6   No current facility-administered medications on file prior to visit.      Review of Systems    see HPI Objective:   Physical Exam  Physical Exam  Nursing note and vitals reviewed.  Constitutional: She is oriented to person, place, and time. She appears well-developed and well-nourished.  HENT:  Head:  Normocephalic and atraumatic.  Cardiovascular: Normal rate and regular rhythm. Exam reveals no gallop and no friction rub.  No murmur heard.  Pulmonary/Chest: Breath sounds normal. She has no wheezes. She has no rales.   Good air flow Neurological: She is alert and oriented to person, place, and time.  Skin: Skin is warm and dry.  Psychiatric: She has a normal mood and affect. Her behavior is normal.       Assessment & Plan:  Asthma  Exercise induced:    OK for albuterol  Instructed if using albuterol more that 3 times in 24 hours, to see me in office  Depression  Gave pt number to Dr. Evelene Croon or Dr Nolen Mu for second opinion  Oc's  Advised ot call GYN office for new RX  Schedule CPE with me

## 2014-01-28 ENCOUNTER — Encounter: Payer: Self-pay | Admitting: Certified Nurse Midwife

## 2014-02-17 ENCOUNTER — Ambulatory Visit: Payer: BC Managed Care – PPO | Admitting: Certified Nurse Midwife

## 2014-02-17 ENCOUNTER — Ambulatory Visit: Payer: BC Managed Care – PPO | Admitting: Obstetrics and Gynecology

## 2014-02-18 ENCOUNTER — Encounter: Payer: Self-pay | Admitting: Certified Nurse Midwife

## 2014-02-18 ENCOUNTER — Ambulatory Visit (INDEPENDENT_AMBULATORY_CARE_PROVIDER_SITE_OTHER): Payer: BC Managed Care – PPO | Admitting: Certified Nurse Midwife

## 2014-02-18 VITALS — BP 128/88 | HR 72 | Resp 18 | Ht 67.0 in | Wt 322.0 lb

## 2014-02-18 DIAGNOSIS — Z23 Encounter for immunization: Secondary | ICD-10-CM

## 2014-02-18 DIAGNOSIS — Z01419 Encounter for gynecological examination (general) (routine) without abnormal findings: Secondary | ICD-10-CM

## 2014-02-18 DIAGNOSIS — Z309 Encounter for contraceptive management, unspecified: Secondary | ICD-10-CM

## 2014-02-18 DIAGNOSIS — Z124 Encounter for screening for malignant neoplasm of cervix: Secondary | ICD-10-CM

## 2014-02-18 DIAGNOSIS — Z0000 Encounter for general adult medical examination without abnormal findings: Secondary | ICD-10-CM

## 2014-02-18 LAB — HEMOGLOBIN, FINGERSTICK: Hemoglobin, fingerstick: 13.3 g/dL (ref 12.0–16.0)

## 2014-02-18 LAB — POCT URINALYSIS DIPSTICK
Bilirubin, UA: NEGATIVE
Blood, UA: NEGATIVE
Glucose, UA: NEGATIVE
Ketones, UA: NEGATIVE
LEUKOCYTES UA: NEGATIVE
NITRITE UA: NEGATIVE
PH UA: 5
PROTEIN UA: NEGATIVE
UROBILINOGEN UA: NEGATIVE

## 2014-02-18 MED ORDER — NORETHIN ACE-ETH ESTRAD-FE 1-20 MG-MCG(24) PO TABS: 1.0000 | ORAL_TABLET | Freq: Every day | ORAL | Status: DC

## 2014-02-18 NOTE — Progress Notes (Signed)
28 y.o. G0P0000 Single Caucasian Fe here for annual exam. Periods normal, regular, and light. Contraception Nuvaring is working ok but would like to switch to [employer] Fe again, feels better on that pill.. No partner change, no STD concerns. Stopped Zoloft and now on Wellbutrin. Starting graduate studies at [employer] Patient sees Pschiatrist for anxiety medications. Has appointment for aex next month with PCP with labs. Patient is serious about weight control now and is going to work with PCP as needed. No other health issues today.  Last Menstrual Period: 02/10/2014          Sexually active: No.  The current method of family planning is NuvaRing vaginal inserts.    Exercising: Yes.    Home exercise routine includes strength/cardio with trainer. Smoker:  no  Health Maintenance: Pap:  09/06/10 neg MMG:  none Colonoscopy:  none BMD:   none TDaP:  Pt states its due this month Labs: HGB 13.3   Urine: Normal    reports that she has never smoked. She has never used smokeless tobacco. She reports that she drinks alcohol. She reports that she does not use illicit drugs.  Past Medical History  Diagnosis Date  . Asthma   . History of chicken pox     childhood  . Depression     treated with anxiety- current  counseling   . History of bronchitis   . History of headache   . GERD (gastroesophageal reflux disease)   . Allergic rhinitis     Allergy- shots with Dr Sharyn Lull  . History of migraines   . Herniated disc     back  . Menorrhagia   . Anxiety     has a history of panic disorder  . Amenorrhea     Past Surgical History  Procedure Laterality Date  . Wisdom tooth extraction  2003    Current Outpatient Prescriptions  Medication Sig Dispense Refill  . albuterol (PROVENTIL HFA;VENTOLIN HFA) 108 (90 BASE) MCG/ACT inhaler Inhale 2 inhaltions tid for wheezing.  Use 2 inhlations prior to exercise  1 Inhaler  0  . cetirizine (ZYRTEC) 10 MG tablet Take 10 mg by mouth daily.        . clonazePAM  (KLONOPIN) 0.5 MG tablet Take 1/2 tablet twice a week as needed .       . etonogestrel-ethinyl estradiol (NUVARING) 0.12-0.015 MG/24HR vaginal ring Place 1 each vaginally once. Insert vaginally and leave in place for 3 consecutive weeks, then remove for 1 week.  1 each  12  . fluconazole (DIFLUCAN) 150 MG tablet       . fluticasone (FLONASE) 50 MCG/ACT nasal spray Place 2 sprays into both nostrils daily.  16 g  6  . montelukast (SINGULAIR) 10 MG tablet TAKE 1 TABLET BY MOUTH EVERY DAY  30 tablet  6  . OMEPRAZOLE PO Take 1 tablet by mouth daily.        . Sertraline HCl (ZOLOFT PO) Take 50 mg by mouth daily.        No current facility-administered medications for this visit.    Family History  Problem Relation Age of Onset  . Alcohol abuse Mother   . Hyperlipidemia Mother   . Heart disease Mother   . Hyperlipidemia Father   . Diabetes Father   . Hyperlipidemia Maternal Grandmother   . Alzheimer's disease Maternal Grandmother   . Pancreatic cancer Paternal Grandfather   . Stroke Maternal Grandfather   . Alcohol abuse Maternal Grandfather  ROS:  Pertinent items are noted in HPI.  Otherwise, a comprehensive ROS was negative.  Exam:   BP 128/88  Pulse 72  Ht 5\' 7"  (1.702 m)  Wt 322 lb (146.058 kg)  BMI 50.42 kg/m2  LMP 01/30/2012 Height: 5\' 7"  (170.2 cm)  Ht Readings from Last 3 Encounters:  02/18/14 5\' 7"  (1.702 m)  11/07/13 5\' 7"  (1.702 m)  07/01/13 5\' 7"  (1.702 m)    General appearance: alert, cooperative and appears stated age Head: Normocephalic, without obvious abnormality, atraumatic Neck: no adenopathy, supple, symmetrical, trachea midline and thyroid normal to inspection and palpation and non-palpable Lungs: clear to auscultation bilaterally Breasts: normal appearance, no masses or tenderness, No nipple retraction or dimpling, No nipple discharge or bleeding, No axillary or supraclavicular adenopathy Heart: regular rate and rhythm Abdomen: soft, non-tender; no  masses,  no organomegaly Extremities: extremities normal, atraumatic, no cyanosis or edema Skin: Skin color, texture, turgor normal. No rashes or lesions Lymph nodes: Cervical, supraclavicular, and axillary nodes normal. No abnormal inguinal nodes palpated Neurologic: Grossly normal   Pelvic: External genitalia:  no lesions              Urethra:  normal appearing urethra with no masses, tenderness or lesions              Bartholin's and Skene's: normal                 Vagina: normal appearing vagina with normal color and discharge, no lesions              Cervix: normal, non tender              Pap taken: Yes.   Bimanual Exam:  Uterus:  normal ?, no large masses noted, limited by body habitus              Adnexa: no mass, fullness, tenderness and adnexa not palpable due to body habitus. No large  masses noted.               Rectovaginal: Confirms               Anus:  normal  appearance  A:  Well Woman with normal exam  Contraception Nuvaring but desires change due to period was better on.  Morbid obesity with 13 pound weight gain in past year.  Limited pelvic exam due to obesity  Anxiety on medication with Psychiatrist management, medication change  PCP appointment for lab and aex and weight discussion  Immunization update  P:   Reviewed health and wellness pertinent to exam  Rx Loestrin 24 Fe see order  Discussed limited pelvic exam due to body habitus and recommend PUS for assessment, patient agreeable  Pap smear taken today with HPV reflex  Continue follow up as indicated  Desires TDAP   counseled on breast self exam, STD prevention, HIV risk factors and prevention, use and side effects of OCP's, adequate intake of calcium and vitamin D, diet and exercise  return annually or prn  An After Visit Summary was printed and given to the patient.

## 2014-02-18 NOTE — Patient Instructions (Signed)
General topics  Next pap or exam is  due in 1 year Take a Women's multivitamin Take 1200 mg. of calcium daily - prefer dietary If any concerns in interim to call back  Breast Self-Awareness Practicing breast self-awareness may pick up problems early, prevent significant medical complications, and possibly save your life. By practicing breast self-awareness, you can become familiar with how your breasts look and feel and if your breasts are changing. This allows you to notice changes early. It can also offer you some reassurance that your breast health is good. One way to learn what is normal for your breasts and whether your breasts are changing is to do a breast self-exam. If you find a lump or something that was not present in the past, it is best to contact your caregiver right away. Other findings that should be evaluated by your caregiver include nipple discharge, especially if it is bloody; skin changes or reddening; areas where the skin seems to be pulled in (retracted); or new lumps and bumps. Breast pain is seldom associated with cancer (malignancy), but should also be evaluated by a caregiver. BREAST SELF-EXAM The best time to examine your breasts is 5 7 days after your menstrual period is over.  ExitCare Patient Information 2013 [employer].   Exercise to Stay Healthy Exercise helps you become and stay healthy. EXERCISE IDEAS AND TIPS Choose exercises that:  You enjoy.  Fit into your day. You do not need to exercise really hard to be healthy. You can do exercises at a slow or medium level and stay healthy. You can:  Stretch before and after working out.  Try yoga, Pilates, or tai chi.  Lift weights.  Walk fast, swim, jog, run, climb stairs, bicycle, dance, or rollerskate.  Take aerobic classes. Exercises that burn about 150 calories:  Running 1  miles in 15 minutes.  Playing volleyball for 45 to 60 minutes.  Washing and waxing a car for 45 to 60  minutes.  Playing touch football for 45 minutes.  Walking 1  miles in 35 minutes.  Pushing a stroller 1  miles in 30 minutes.  Playing basketball for 30 minutes.  Raking leaves for 30 minutes.  Bicycling 5 miles in 30 minutes.  Walking 2 miles in 30 minutes.  Dancing for 30 minutes.  Shoveling snow for 15 minutes.  Swimming laps for 20 minutes.  Walking up stairs for 15 minutes.  Bicycling 4 miles in 15 minutes.  Gardening for 30 to 45 minutes.  Jumping rope for 15 minutes.  Washing windows or floors for 45 to 60 minutes. Document Released: 08/20/2010 Document Revised: 10/10/2011 Document Reviewed: 08/20/2010 ExitCare Patient Information 2013 [employer].   Other topics ( that may be useful information):    Sexually Transmitted Disease Sexually transmitted disease (STD) refers to any infection that is passed from person to person during sexual activity. This may happen by way of saliva, semen, blood, vaginal mucus, or urine. Common STDs include:  Gonorrhea.  Chlamydia.  Syphilis.  HIV/AIDS.  Genital herpes.  Hepatitis B and C.  Trichomonas.  Human papillomavirus (HPV).  Pubic lice. CAUSES  An STD may be spread by bacteria, virus, or parasite. A person can get an STD by:  Sexual intercourse with an infected person.  Sharing sex toys with an infected person.  Sharing needles with an infected person.  Having intimate contact with the genitals, mouth, or rectal areas of an infected person. SYMPTOMS  Some people may not have any symptoms, but   they can still pass the infection to others. Different STDs have different symptoms. Symptoms include:  Painful or bloody urination.  Pain in the pelvis, abdomen, vagina, anus, throat, or eyes.  Skin rash, itching, irritation, growths, or sores (lesions). These usually occur in the genital or anal area.  Abnormal vaginal discharge.  Penile discharge in men.  Soft, flesh-colored skin growths in the  genital or anal area.  Fever.  Pain or bleeding during sexual intercourse.  Swollen glands in the groin area.  Yellow skin and eyes (jaundice). This is seen with hepatitis. DIAGNOSIS  To make a diagnosis, your caregiver may:  Take a medical history.  Perform a physical exam.  Take a specimen (culture) to be examined.  Examine a sample of discharge under a microscope.  Perform blood test TREATMENT   Chlamydia, gonorrhea, trichomonas, and syphilis can be cured with antibiotic medicine.  Genital herpes, hepatitis, and HIV can be treated, but not cured, with prescribed medicines. The medicines will lessen the symptoms.  Genital warts from HPV can be treated with medicine or by freezing, burning (electrocautery), or surgery. Warts may come back.  HPV is a virus and cannot be cured with medicine or surgery.However, abnormal areas may be followed very closely by your caregiver and may be removed from the cervix, vagina, or vulva through office procedures or surgery. If your diagnosis is confirmed, your recent sexual partners need treatment. This is true even if they are symptom-free or have a negative culture or evaluation. They should not have sex until their caregiver says it is okay. HOME CARE INSTRUCTIONS  All sexual partners should be informed, tested, and treated for all STDs.  Take your antibiotics as directed. Finish them even if you start to feel better.  Only take over-the-counter or prescription medicines for pain, discomfort, or fever as directed by your caregiver.  Rest.  Eat a balanced diet and drink enough fluids to keep your urine clear or pale yellow.  Do not have sex until treatment is completed and you have followed up with your caregiver. STDs should be checked after treatment.  Keep all follow-up appointments, Pap tests, and blood tests as directed by your caregiver.  Only use latex condoms and water-soluble lubricants during sexual activity. Do not use  petroleum jelly or oils.  Avoid alcohol and illegal drugs.  Get vaccinated for HPV and hepatitis. If you have not received these vaccines in the past, talk to your caregiver about whether one or both might be right for you.  Avoid risky sex practices that can break the skin. The only way to avoid getting an STD is to avoid all sexual activity.Latex condoms and dental dams (for oral sex) will help lessen the risk of getting an STD, but will not completely eliminate the risk. SEEK MEDICAL CARE IF:   You have a fever.  You have any new or worsening symptoms. Document Released: 10/08/2002 Document Revised: 10/10/2011 Document Reviewed: 10/15/2010 [facility] Patient Information 2013 [place].    Domestic Abuse You are being battered or abused if someone close to you hits, pushes, or physically hurts you in any way. You also are being abused if you are forced into activities. You are being sexually abused if you are forced to have sexual contact of any kind. You are being emotionally abused if you are made to feel worthless or if you are constantly threatened. It is important to remember that help is available. No one has the right to abuse you. PREVENTION OF FURTHER  ABUSE  Learn the warning signs of danger. This varies with situations but may include: the use of alcohol, threats, isolation from friends and family, or forced sexual contact. Leave if you feel that violence is going to occur.  If you are attacked or beaten, report it to the police so the abuse is documented. You do not have to press charges. The police can protect you while you or the attackers are leaving. Get the officer's name and badge number and a copy of the report.  Find someone you can trust and tell them what is happening to you: your caregiver, a nurse, clergy member, close friend or family member. Feeling ashamed is natural, but remember that you have done nothing wrong. No one deserves abuse. Document Released:  07/15/2000 Document Revised: 10/10/2011 Document Reviewed: 09/23/2010 ExitCare Patient Information 2013 [employer].    How Much is Too Much Alcohol? Drinking too much alcohol can cause injury, accidents, and health problems. These types of problems can include:   Car crashes.  Falls.  Family fighting (domestic violence).  Drowning.  Fights.  Injuries.  Burns.  Damage to certain organs.  Having a baby with birth defects. ONE DRINK CAN BE TOO MUCH WHEN YOU ARE:  Working.  Pregnant or breastfeeding.  Taking medicines. Ask your doctor.  Driving or planning to drive. If you or someone you know has a drinking problem, get help from a doctor.  Document Released: 05/14/2009 Document Revised: 10/10/2011 Document Reviewed: 05/14/2009 ExitCare Patient Information 2013 [employer].   Smoking Hazards Smoking cigarettes is extremely bad for your health. Tobacco smoke has over 200 known poisons in it. There are over 60 chemicals in tobacco smoke that cause cancer. Some of the chemicals found in cigarette smoke include:   Cyanide.  Benzene.  Formaldehyde.  Methanol (wood alcohol).  Acetylene (fuel used in welding torches).  Ammonia. Cigarette smoke also contains the poisonous gases nitrogen oxide and carbon monoxide.  Cigarette smokers have an increased risk of many serious medical problems and Smoking causes approximately:  90% of all lung cancer deaths in men.  80% of all lung cancer deaths in women.  90% of deaths from chronic obstructive lung disease. Compared with nonsmokers, smoking increases the risk of:  Coronary heart disease by 2 to 4 times.  Stroke by 2 to 4 times.  Men developing lung cancer by 23 times.  Women developing lung cancer by 13 times.  Dying from chronic obstructive lung diseases by 12 times.  . Smoking is the most preventable cause of death and disease in our society.  WHY IS SMOKING ADDICTIVE?  Nicotine is the chemical  agent in tobacco that is capable of causing addiction or dependence.  When you smoke and inhale, nicotine is absorbed rapidly into the bloodstream through your lungs. Nicotine absorbed through the lungs is capable of creating a powerful addiction. Both inhaled and non-inhaled nicotine may be addictive.  Addiction studies of cigarettes and spit tobacco show that addiction to nicotine occurs mainly during the teen years, when young people begin using tobacco products. WHAT ARE THE BENEFITS OF QUITTING?  There are many health benefits to quitting smoking.   Likelihood of developing cancer and heart disease decreases. Health improvements are seen almost immediately.  Blood pressure, pulse rate, and breathing patterns start returning to normal soon after quitting. QUITTING SMOKING   American Lung Association - 1-800-LUNGUSA  American Cancer Society - 1-800-ACS-2345 Document Released: 08/25/2004 Document Revised: 10/10/2011 Document Reviewed: 04/29/2009 ExitCare Patient Information 2013 ExitCare,   LLC.   Stress Management Stress is a state of physical or mental tension that often results from changes in your life or normal routine. Some common causes of stress are:  Death of a loved one.  Injuries or severe illnesses.  Getting fired or changing jobs.  Moving into a new home. Other causes may be:  Sexual problems.  Business or financial losses.  Taking on a large debt.  Regular conflict with someone at home or at work.  Constant tiredness from lack of sleep. It is not just bad things that are stressful. It may be stressful to:  Win the lottery.  Get married.  Buy a new car. The amount of stress that can be easily tolerated varies from person to person. Changes generally cause stress, regardless of the types of change. Too much stress can affect your health. It may lead to physical or emotional problems. Too little stress (boredom) may also become stressful. SUGGESTIONS TO  REDUCE STRESS:  Talk things over with your family and friends. It often is helpful to share your concerns and worries. If you feel your problem is serious, you may want to get help from a professional counselor.  Consider your problems one at a time instead of lumping them all together. Trying to take care of everything at once may seem impossible. List all the things you need to do and then start with the most important one. Set a goal to accomplish 2 or 3 things each day. If you expect to do too many in a single day you will naturally fail, causing you to feel even more stressed.  Do not use alcohol or drugs to relieve stress. Although you may feel better for a short time, they do not remove the problems that caused the stress. They can also be habit forming.  Exercise regularly - at least 3 times per week. Physical exercise can help to relieve that "uptight" feeling and will relax you.  The shortest distance between despair and hope is often a good night's sleep.  Go to bed and get up on time allowing yourself time for appointments without being rushed.  Take a short "time-out" period from any stressful situation that occurs during the day. Close your eyes and take some deep breaths. Starting with the muscles in your face, tense them, hold it for a few seconds, then relax. Repeat this with the muscles in your neck, shoulders, hand, stomach, back and legs.  Take good care of yourself. Eat a balanced diet and get plenty of rest.  Schedule time for having fun. Take a break from your daily routine to relax. HOME CARE INSTRUCTIONS   Call if you feel overwhelmed by your problems and feel you can no longer manage them on your own.  Return immediately if you feel like hurting yourself or someone else. Document Released: 01/11/2001 Document Revised: 10/10/2011 Document Reviewed: 09/03/2007 [facility] Patient Information 2013 [place].  Exercise to Lose Weight Exercise and a healthy diet  may help you lose weight. Your doctor may suggest specific exercises. EXERCISE IDEAS AND TIPS  Choose low-cost things you enjoy doing, such as walking, bicycling, or exercising to workout videos.  Take stairs instead of the elevator.  Walk during your lunch break.  Park your car further away from work or school.  Go to a gym or an exercise class.  Start with 5 to 10 minutes of exercise each day. Build up to 30 minutes of exercise 4 to 6 days a week.  Wear shoes with good support and comfortable clothes.  Stretch before and after working out.  Work out until you breathe harder and your heart beats faster.  Drink extra water when you exercise.  Do not do so much that you hurt yourself, feel dizzy, or get very short of breath. Exercises that burn about 150 calories:  Running 1  miles in 15 minutes.  Playing volleyball for 45 to 60 minutes.  Washing and waxing a car for 45 to 60 minutes.  Playing touch football for 45 minutes.  Walking 1  miles in 35 minutes.  Pushing a stroller 1  miles in 30 minutes.  Playing basketball for 30 minutes.  Raking leaves for 30 minutes.  Bicycling 5 miles in 30 minutes.  Walking 2 miles in 30 minutes.  Dancing for 30 minutes.  Shoveling snow for 15 minutes.  Swimming laps for 20 minutes.  Walking up stairs for 15 minutes.  Bicycling 4 miles in 15 minutes.  Gardening for 30 to 45 minutes.  Jumping rope for 15 minutes.  Washing windows or floors for 45 to 60 minutes. Document Released: 08/20/2010 Document Revised: 10/10/2011 Document Reviewed: 08/20/2010 [facility] Patient Information 2015 [place], [place]. This information is not intended to replace advice given to you by your health care provider. Make sure you discuss any questions you have with your health care provider.

## 2014-02-20 LAB — IPS PAP TEST WITH REFLEX TO HPV

## 2014-02-20 NOTE — Progress Notes (Signed)
Note reviewed, agree with plan.  Leslieanne Cobarrubias, MD  

## 2014-02-27 ENCOUNTER — Telehealth: Payer: Self-pay | Admitting: Certified Nurse Midwife

## 2014-02-27 NOTE — Telephone Encounter (Signed)
Spoke with patient. Advised that per benefit quote received, she will be responsible for $30 copay when she comes in for PUS. °Patient agreeable. °Scheduled PUS. °Advised patient of 72 hour cancellation policy and $100 cancellation fee. Patient agreeable. °

## 2014-03-04 ENCOUNTER — Other Ambulatory Visit: Payer: Self-pay | Admitting: Gynecology

## 2014-03-04 ENCOUNTER — Ambulatory Visit (INDEPENDENT_AMBULATORY_CARE_PROVIDER_SITE_OTHER): Payer: BC Managed Care – PPO | Admitting: Gynecology

## 2014-03-04 ENCOUNTER — Ambulatory Visit (INDEPENDENT_AMBULATORY_CARE_PROVIDER_SITE_OTHER): Payer: BC Managed Care – PPO

## 2014-03-04 DIAGNOSIS — E282 Polycystic ovarian syndrome: Secondary | ICD-10-CM

## 2014-03-04 NOTE — Progress Notes (Signed)
      Pt here for PUS to evaluate pelvis due to obesity. Images reviewed, normal appearing uterus.  Ovaries with multiple small follicles in periphery, suggestive of PCOS. Pt is on nuvaring and is planning to change back to oral Loestrin 24.  Pt states that she is changing since she feels that her cycle starts 5-6d off ring and that replacing it on time will result in residual lining left behind. We discussed the role of ocp and affect on ovary, being off for 7d on a low dose pill may result in follicle stimulation and extra estrogen production, in addition, she may ovulate from time to time. She would do better on a pill with a shorter placebo window such as the Loestrin 24.   Risks of obestiy and pcos on overall health such as increased diabetes, CV risks reviewed. Questions addressed 8568m spent couseling >50% face tp face

## 2014-03-06 ENCOUNTER — Other Ambulatory Visit: Payer: Self-pay | Admitting: *Deleted

## 2014-03-06 ENCOUNTER — Ambulatory Visit (INDEPENDENT_AMBULATORY_CARE_PROVIDER_SITE_OTHER): Payer: BC Managed Care – PPO | Admitting: Internal Medicine

## 2014-03-06 VITALS — Temp 98.1°F | Ht 67.0 in | Wt 321.0 lb

## 2014-03-06 DIAGNOSIS — Z139 Encounter for screening, unspecified: Secondary | ICD-10-CM

## 2014-03-06 DIAGNOSIS — F329 Major depressive disorder, single episode, unspecified: Secondary | ICD-10-CM

## 2014-03-06 DIAGNOSIS — F419 Anxiety disorder, unspecified: Secondary | ICD-10-CM

## 2014-03-06 DIAGNOSIS — F341 Dysthymic disorder: Secondary | ICD-10-CM

## 2014-03-06 DIAGNOSIS — F32A Depression, unspecified: Secondary | ICD-10-CM

## 2014-03-06 DIAGNOSIS — R319 Hematuria, unspecified: Secondary | ICD-10-CM

## 2014-03-06 DIAGNOSIS — J4520 Mild intermittent asthma, uncomplicated: Secondary | ICD-10-CM

## 2014-03-06 DIAGNOSIS — J45909 Unspecified asthma, uncomplicated: Secondary | ICD-10-CM

## 2014-03-06 DIAGNOSIS — Z0000 Encounter for general adult medical examination without abnormal findings: Secondary | ICD-10-CM | POA: Diagnosis not present

## 2014-03-06 DIAGNOSIS — N83209 Unspecified ovarian cyst, unspecified side: Secondary | ICD-10-CM

## 2014-03-06 DIAGNOSIS — N83201 Unspecified ovarian cyst, right side: Secondary | ICD-10-CM

## 2014-03-06 DIAGNOSIS — N83202 Unspecified ovarian cyst, left side: Secondary | ICD-10-CM

## 2014-03-06 LAB — CBC WITH DIFFERENTIAL/PLATELET
Basophils Absolute: 0 10*3/uL (ref 0.0–0.1)
Basophils Relative: 0 % (ref 0–1)
Eosinophils Absolute: 0.2 10*3/uL (ref 0.0–0.7)
Eosinophils Relative: 3 % (ref 0–5)
HCT: 37.7 % (ref 36.0–46.0)
HEMOGLOBIN: 12.8 g/dL (ref 12.0–15.0)
Lymphocytes Relative: 34 % (ref 12–46)
Lymphs Abs: 2.8 10*3/uL (ref 0.7–4.0)
MCH: 26.3 pg (ref 26.0–34.0)
MCHC: 34 g/dL (ref 30.0–36.0)
MCV: 77.4 fL — ABNORMAL LOW (ref 78.0–100.0)
MONOS PCT: 5 % (ref 3–12)
Monocytes Absolute: 0.4 10*3/uL (ref 0.1–1.0)
Neutro Abs: 4.8 10*3/uL (ref 1.7–7.7)
Neutrophils Relative %: 58 % (ref 43–77)
Platelets: 240 10*3/uL (ref 150–400)
RBC: 4.87 MIL/uL (ref 3.87–5.11)
RDW: 14.2 % (ref 11.5–15.5)
WBC: 8.2 10*3/uL (ref 4.0–10.5)

## 2014-03-06 LAB — POCT URINALYSIS DIPSTICK
BILIRUBIN UA: NEGATIVE
GLUCOSE UA: NEGATIVE
Ketones, UA: NEGATIVE
Nitrite, UA: NEGATIVE
Protein, UA: NEGATIVE
Spec Grav, UA: 1.01
Urobilinogen, UA: 0.2
pH, UA: 6

## 2014-03-06 NOTE — Patient Instructions (Signed)
Set up referral to dr. April MansonYalcinkaya  ?? PCOS    To lab today

## 2014-03-06 NOTE — Progress Notes (Signed)
Subjective:    Patient ID: Jessica Castro, female    DOB: [birth year], 28 y.o.   MRN: [id number]  HPI  Jessica Castro is here for CPE  HM:  She had a pap by her GYN with A TVUS  And was told she had polycystic ovaries.  Execise induce asthma/ allergic rhinitis   Singulair helps :  No wheezing  Peak flow today 500  Obesity  She has lost 9 lbs since last visit.  She attributes this to Buspar  Depression  She did see a new psychiatrist  Dr.  Sink who took her off Zoloft and place her on Buspar  She feels much better      Allergies  Allergen Reactions  . Peanuts [Peanut Oil]    Past Medical History  Diagnosis Date  . Asthma   . History of chicken pox     childhood  . Depression     treated with anxiety- current  counseling   . History of bronchitis   . History of headache   . GERD (gastroesophageal reflux disease)   . Allergic rhinitis     Allergy- shots with Dr Sharyn Lull  . History of migraines   . Herniated disc     back  . Menorrhagia   . Anxiety     has a history of panic disorder  . Amenorrhea    Past Surgical History  Procedure Laterality Date  . Wisdom tooth extraction  2003   History   Social History  . Marital Status: Single    Spouse Name: N/A    Number of Children: N/A  . Years of Education: N/A   Occupational History  . Not on file.   Social History Main Topics  . Smoking status: Never Smoker   . Smokeless tobacco: Never Used  . Alcohol Use: 0.0 - 1.0 oz/week    0-2 drink(s) per week     Comment: social  . Drug Use: No  . Sexual Activity: Not Currently    Partners: Male    Birth Control/ Protection: Inserts   Other Topics Concern  . Not on file   Social History Narrative  . No narrative on file   Family History  Problem Relation Age of Onset  . Alcohol abuse Mother   . Hyperlipidemia Mother   . Heart disease Mother   . Hyperlipidemia Father   . Diabetes Father   . Hyperlipidemia Maternal Grandmother   . Alzheimer's disease Maternal  Grandmother   . Pancreatic cancer Paternal Grandfather   . Stroke Maternal Grandfather   . Alcohol abuse Maternal Grandfather    Patient Active Problem List   Diagnosis Date Noted  . Onychocryptosis 07/23/2013  . Anxiety and depression 07/14/2013  . GERD (gastroesophageal reflux disease) 07/14/2013  . Morbid obesity 07/14/2013  . Herniated lumbar disc without myelopathy 07/14/2013  . Sinusitis 07/01/2013  . Paronychia of toe of right foot 07/01/2013  . Urinary frequency 05/09/2012  . Hemorrhoids 06/02/2011  . Constipation 03/21/2011  . Lumbar back pain 03/21/2011  . Extrinsic asthma 03/21/2011  . Allergic rhinitis 03/21/2011  . Migraine 03/21/2011   Current Outpatient Prescriptions on File Prior to Visit  Medication Sig Dispense Refill  . albuterol (PROVENTIL HFA;VENTOLIN HFA) 108 (90 BASE) MCG/ACT inhaler Inhale 2 inhaltions tid for wheezing.  Use 2 inhlations prior to exercise  1 Inhaler  0  . busPIRone (BUSPAR) 15 MG tablet Take 15 mg by mouth 2 (two) times daily.      . cetirizine (ZYRTEC) 10  MG tablet Take 10 mg by mouth daily.        . clonazePAM (KLONOPIN) 0.5 MG tablet Take 1/2 tablet twice a week as needed .       . fluticasone (FLONASE) 50 MCG/ACT nasal spray Place 2 sprays into both nostrils daily.  16 g  6  . montelukast (SINGULAIR) 10 MG tablet TAKE 1 TABLET BY MOUTH EVERY DAY  30 tablet  6  . Norethindrone Acetate-Ethinyl Estrad-FE (LOESTRIN 24 FE) 1-20 MG-MCG(24) tablet Take 1 tablet by mouth daily.  3 Package  4  . OMEPRAZOLE PO Take 1 tablet by mouth daily.        Marland Kitchen. buPROPion (WELLBUTRIN XL) 150 MG 24 hr tablet Take 150 mg by mouth daily.       No current facility-administered medications on file prior to visit.      Review of Systems  Respiratory: Negative for cough, choking, chest tightness, shortness of breath and wheezing.   Cardiovascular: Negative for chest pain, palpitations and leg swelling.  Gastrointestinal: Negative for abdominal pain.  All other  systems reviewed and are negative.      Objective:   Physical Exam Physical Exam  Nursing note and vitals reviewed.  Constitutional: She is oriented to person, place, and time. She appears well-developed and well-nourished.  HENT:  Head: Normocephalic and atraumatic.  Right Ear: Tympanic membrane and ear canal normal. No drainage. Tympanic membrane is not injected and not erythematous.  Left Ear: Tympanic membrane and ear canal normal. No drainage. Tympanic membrane is not injected and not erythematous.  Nose: Nose normal. Right sinus exhibits no maxillary sinus tenderness and no frontal sinus tenderness. Left sinus exhibits no maxillary sinus tenderness and no frontal sinus tenderness.  Mouth/Throat: Oropharynx is clear and moist. No oral lesions. No oropharyngeal exudate.  Eyes: Conjunctivae and EOM are normal. Pupils are equal, round, and reactive to light.  Neck: Normal range of motion. Neck supple. No JVD present. Carotid bruit is not present. No mass and no thyromegaly present.  Cardiovascular: Normal rate, regular rhythm, S1 normal, S2 normal and intact distal pulses. Exam reveals no gallop and no friction rub.  No murmur heard.  Pulses:  Carotid pulses are 2+ on the right side, and 2+ on the left side.  Dorsalis pedis pulses are 2+ on the right side, and 2+ on the left side.  No carotid bruit. No LE edema  Pulmonary/Chest: Breath sounds normal. She has no wheezes. She has no rales. She exhibits no tenderness.   Breast no discrete mass no nipple discharge no axillary adenopathy bilaterally Abdominal: Soft. Bowel sounds are normal. She exhibits no distension and no mass. There is no hepatosplenomegaly. There is no tenderness. There is no CVA tenderness.  Musculoskeletal: Normal range of motion.  No active synovitis to joints.  Lymphadenopathy:  She has no cervical adenopathy.  She has no axillary adenopathy.  Right: No inguinal and no supraclavicular adenopathy present.  Left: No  inguinal and no supraclavicular adenopathy present.  Neurological: She is alert and oriented to person, place, and time. She has normal strength and normal reflexes. She displays no tremor. No cranial nerve deficit or sensory deficit. Coordination and gait normal.  Skin: Skin is warm and dry. No rash noted. No cyanosis. Nails show no clubbing.  Psychiatric: She has a normal mood and affect. Her speech is normal and behavior is normal. Cognition and memory are normal.           Assessment & Plan:  HM  See scanned sheet  Ex induced asthma  proventil prior to exercise  contineu Singulair  Depression on Buspar contineu with psychiatrist  Morbid obesity  She has lost 10 lbs now   ?? PCOS  She has polycystic ovaries on TVUS  She would like to see a reproductive endocrinologist   Will refer to Dr. April Manson

## 2014-03-07 LAB — COMPREHENSIVE METABOLIC PANEL
ALBUMIN: 3.9 g/dL (ref 3.5–5.2)
ALK PHOS: 102 U/L (ref 39–117)
ALT: 13 U/L (ref 0–35)
AST: 15 U/L (ref 0–37)
BUN: 8 mg/dL (ref 6–23)
CO2: 25 meq/L (ref 19–32)
Calcium: 9.4 mg/dL (ref 8.4–10.5)
Chloride: 106 mEq/L (ref 96–112)
Creat: 0.93 mg/dL (ref 0.50–1.10)
Glucose, Bld: 83 mg/dL (ref 70–99)
POTASSIUM: 4.2 meq/L (ref 3.5–5.3)
Sodium: 140 mEq/L (ref 135–145)
TOTAL PROTEIN: 6.3 g/dL (ref 6.0–8.3)
Total Bilirubin: 0.4 mg/dL (ref 0.2–1.2)

## 2014-03-07 LAB — CULTURE, URINE COMPREHENSIVE: Colony Count: >=100000

## 2014-03-07 LAB — VITAMIN D 25 HYDROXY (VIT D DEFICIENCY, FRACTURES): VIT D 25 HYDROXY: 30 ng/mL (ref 30–89)

## 2014-03-07 LAB — LIPID PANEL
Cholesterol: 164 mg/dL (ref 0–200)
HDL: 38 mg/dL — ABNORMAL LOW (ref >39)
LDL CALC: 96 mg/dL (ref 0–99)
Total CHOL/HDL Ratio: 4.3 Ratio
Triglycerides: 148 mg/dL (ref <150)
VLDL: 30 mg/dL (ref 0–40)

## 2014-03-07 LAB — TSH: TSH: 2.188 u[IU]/mL (ref 0.350–4.500)

## 2014-03-12 ENCOUNTER — Encounter: Payer: Self-pay | Admitting: Gynecology

## 2014-07-01 ENCOUNTER — Ambulatory Visit (INDEPENDENT_AMBULATORY_CARE_PROVIDER_SITE_OTHER): Payer: No Typology Code available for payment source | Admitting: Psychiatry

## 2014-07-01 ENCOUNTER — Encounter (HOSPITAL_COMMUNITY): Payer: Self-pay | Admitting: Psychiatry

## 2014-07-01 VITALS — BP 139/60 | HR 98 | Ht 67.0 in | Wt 320.0 lb

## 2014-07-01 DIAGNOSIS — F331 Major depressive disorder, recurrent, moderate: Secondary | ICD-10-CM | POA: Diagnosis not present

## 2014-07-01 DIAGNOSIS — F410 Panic disorder [episodic paroxysmal anxiety] without agoraphobia: Secondary | ICD-10-CM | POA: Diagnosis not present

## 2014-07-01 MED ORDER — ESCITALOPRAM OXALATE 5 MG PO TABS: 5.0000 mg | ORAL_TABLET | Freq: Every day | ORAL | Status: DC

## 2014-07-01 MED ORDER — CLONAZEPAM 0.5 MG PO TABS: 0.5000 mg | ORAL_TABLET | Freq: Every day | ORAL | Status: DC
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
## 2014-07-18 ENCOUNTER — Encounter (HOSPITAL_COMMUNITY): Payer: Self-pay | Admitting: Psychiatry

## 2014-07-18 ENCOUNTER — Ambulatory Visit (INDEPENDENT_AMBULATORY_CARE_PROVIDER_SITE_OTHER): Payer: No Typology Code available for payment source | Admitting: Psychiatry

## 2014-07-18 VITALS — Ht 67.0 in | Wt 320.0 lb

## 2014-07-18 DIAGNOSIS — F410 Panic disorder [episodic paroxysmal anxiety] without agoraphobia: Secondary | ICD-10-CM | POA: Diagnosis not present

## 2014-07-18 DIAGNOSIS — F331 Major depressive disorder, recurrent, moderate: Secondary | ICD-10-CM

## 2014-07-18 MED ORDER — ESCITALOPRAM OXALATE 10 MG PO TABS: 10.0000 mg | ORAL_TABLET | Freq: Every day | ORAL | Status: DC
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
## 2014-08-05 NOTE — Progress Notes (Signed)
Subjective:    Patient ID: Jessica Castro, female    DOB: [birth year], 28 y.o.   MRN: [id number]  HPI 12/18 [facility] note Treatment Plan and Summary: Increase lexapro to  For depression and panic symptoms.  Continue Klonopin she has some tablets left over, she can stretch it for the next couple of months she has hardly used it in the last 2 weeks she will continue therapy with her provider outside this clinic she is going at [facility]. Do recommend sleep study for some concern of poor sleep, waking up tired and risk factors. Pertinent Labs and Relevant Prior Notes reviewed. Medication Side effects, benefits and risks reviewed/discussed with Patient. Time given for patient to respond and asks questions regarding the Diagnosis and Medications. Safety concerns and to report to ER if suicidal or call 911. Relevant Medications refilled or called in to pharmacy. Discussed weight maintenance and Sleep Hygiene. Follow up with Primary care provider in regards to Medical conditions. Recommend compliance with medications and follow up office appointments. Discussed to avail opportunity to consider or/and continue Individual therapy with Counselor. Greater than 50% of time was spend in counseling and coordination of care with the patient.  Schedule for Follow up visit in 4 weeks or call in earlier as necessary.  03/2014 note HM See scanned sheet  Ex induced asthma proventil prior to exercise contineu Singulair  Depression on Buspar contineu with psychiatrist  Morbid obesity She has lost 10 lbs now   ?? PCOS She has polycystic ovaries on TVUS She would like to  TODAY :  Jessica Castro is here for follow up.   She reports since increasing her lexapro to 10 mg she cannot sleep.      She did see Dr. April Manson in September and was told she has PCOS  I do not have note.  She was placed on metformin but she cannot tolerate due to diarrhea.    She would like to see a pulmonologist for sleep  study  .  Lots of snoring and sleep disturbance   Allergies  Allergen Reactions  . Peanuts [Peanut Oil]    Past Medical History  Diagnosis Date  . Asthma   . History of chicken pox     childhood  . Depression     treated with anxiety- current  counseling   . History of bronchitis   . History of headache   . GERD (gastroesophageal reflux disease)   . Allergic rhinitis     Allergy- shots with Dr Sharyn Lull  . History of migraines   . Herniated disc     back  . Menorrhagia   . Anxiety     has a history of panic disorder  . Amenorrhea    Past Surgical History  Procedure Laterality Date  . Wisdom tooth extraction  2003   History   Social History  . Marital Status: Single    Spouse Name: N/A    Number of Children: N/A  . Years of Education: N/A   Occupational History  . Not on file.   Social History Main Topics  . Smoking status: Never Smoker   . Smokeless tobacco: Never Used  . Alcohol Use: 0.0 - 1.0 oz/week    0-2 Not specified per week     Comment: social  . Drug Use: No  . Sexual Activity:    Partners: Male    Birth Control/ Protection: Inserts   Other Topics Concern  . Not on file   Social History Narrative  Family History  Problem Relation Age of Onset  . Alcohol abuse Mother   . Hyperlipidemia Mother   . Heart disease Mother   . Anxiety disorder Mother   . Hyperlipidemia Father   . Diabetes Father   . Hyperlipidemia Maternal Grandmother   . Alzheimer's disease Maternal Grandmother   . Pancreatic cancer Paternal Grandfather   . Stroke Maternal Grandfather   . Alcohol abuse Maternal Grandfather   . Anxiety disorder Sister    Patient Active Problem List   Diagnosis Date Noted  . Bilateral ovarian cysts per GYN 01/2014 03/06/2014  . Onychocryptosis 07/23/2013  . Anxiety and depression 07/14/2013  . GERD (gastroesophageal reflux disease) 07/14/2013  . Morbid obesity 07/14/2013  . Herniated lumbar disc without myelopathy 07/14/2013  . Sinusitis  07/01/2013  . Paronychia of toe of right foot 07/01/2013  . Urinary frequency 05/09/2012  . Hemorrhoids 06/02/2011  . Constipation 03/21/2011  . Lumbar back pain 03/21/2011  . Extrinsic asthma 03/21/2011  . Allergic rhinitis 03/21/2011  . Migraine 03/21/2011   Current Outpatient Prescriptions on File Prior to Visit  Medication Sig Dispense Refill  . albuterol (PROVENTIL HFA;VENTOLIN HFA) 108 (90 BASE) MCG/ACT inhaler Inhale 2 inhaltions tid for wheezing.  Use 2 inhlations prior to exercise 1 Inhaler 0  . cetirizine (ZYRTEC) 10 MG tablet Take 10 mg by mouth daily.      . clonazePAM (KLONOPIN) 0.5 MG tablet Take 1 tablet (0.5 mg total) by mouth daily. 15 tablet 0  . escitalopram (LEXAPRO) 10 MG tablet Take 1 tablet (10 mg total) by mouth daily. 30 tablet 0  . fluticasone (FLONASE) 50 MCG/ACT nasal spray Place 2 sprays into both nostrils daily. 16 g 6  . montelukast (SINGULAIR) 10 MG tablet TAKE 1 TABLET BY MOUTH EVERY DAY 30 tablet 6  . Norethindrone Acetate-Ethinyl Estrad-FE (LOESTRIN 24 FE) 1-20 MG-MCG(24) tablet Take 1 tablet by mouth daily. 3 Package 4  . OMEPRAZOLE PO Take 1 tablet by mouth daily.       No current facility-administered medications on file prior to visit.       Review of Systems    see HPI Objective:   Physical Exam Physical Exam  Nursing note and vitals reviewed.  Constitutional: She is oriented to person, place, and time. She appears well-developed and well-nourished.  HENT:  Head: Normocephalic and atraumatic.  Cardiovascular: Normal rate and regular rhythm. Exam reveals no gallop and no friction rub.  No murmur heard.  Pulmonary/Chest: Breath sounds normal. She has no wheezes. She has no rales.  Neurological: She is alert and oriented to person, place, and time.  Skin: Skin is warm and dry.  Psychiatric: She has a normal mood and affect. Her behavior is normal.              Assessment & Plan:  Depression/insomnia  OK to hold lexapro for one  day and then go back to 5 mg.  I did give RX for klonopin that she can use at 0.5 mg dose for sleep on a temporary basis  PCOS  Will need Dr. Lyndal RainbowYalcinkaya's note   OK to try Glumetza  Snoring sleep disturbance:  [facility]  Obesity:

## 2014-08-06 ENCOUNTER — Ambulatory Visit (INDEPENDENT_AMBULATORY_CARE_PROVIDER_SITE_OTHER): Payer: PRIVATE HEALTH INSURANCE | Admitting: Internal Medicine

## 2014-08-06 ENCOUNTER — Telehealth: Payer: Self-pay | Admitting: *Deleted

## 2014-08-06 ENCOUNTER — Encounter: Payer: Self-pay | Admitting: Internal Medicine

## 2014-08-06 VITALS — BP 141/92 | HR 99 | Resp 16 | Ht 67.0 in | Wt 319.0 lb

## 2014-08-06 DIAGNOSIS — F418 Other specified anxiety disorders: Secondary | ICD-10-CM

## 2014-08-06 DIAGNOSIS — F32A Depression, unspecified: Secondary | ICD-10-CM

## 2014-08-06 DIAGNOSIS — F329 Major depressive disorder, single episode, unspecified: Secondary | ICD-10-CM

## 2014-08-06 DIAGNOSIS — E282 Polycystic ovarian syndrome: Secondary | ICD-10-CM

## 2014-08-06 DIAGNOSIS — R0683 Snoring: Secondary | ICD-10-CM

## 2014-08-06 DIAGNOSIS — F419 Anxiety disorder, unspecified: Secondary | ICD-10-CM

## 2014-08-06 MED ORDER — CLONAZEPAM 0.5 MG PO TABS: ORAL_TABLET | ORAL | Status: DC

## 2014-08-06 NOTE — Telephone Encounter (Signed)
Jessica AddisonKatie is aware that she has an appointment [facility] on 08/11/2013 @ 4pm.

## 2014-08-11 ENCOUNTER — Encounter (INDEPENDENT_AMBULATORY_CARE_PROVIDER_SITE_OTHER): Payer: Self-pay

## 2014-08-11 ENCOUNTER — Ambulatory Visit (INDEPENDENT_AMBULATORY_CARE_PROVIDER_SITE_OTHER): Payer: No Typology Code available for payment source | Admitting: Psychiatry

## 2014-08-11 ENCOUNTER — Encounter (HOSPITAL_COMMUNITY): Payer: Self-pay | Admitting: Psychiatry

## 2014-08-11 VITALS — BP 115/92 | HR 80 | Ht 67.0 in | Wt 320.0 lb

## 2014-08-11 DIAGNOSIS — F331 Major depressive disorder, recurrent, moderate: Secondary | ICD-10-CM

## 2014-08-11 DIAGNOSIS — F410 Panic disorder [episodic paroxysmal anxiety] without agoraphobia: Secondary | ICD-10-CM

## 2014-08-11 MED ORDER — GABAPENTIN 100 MG PO CAPS: 100.0000 mg | ORAL_CAPSULE | Freq: Two times a day (BID) | ORAL | Status: DC

## 2014-08-11 MED ORDER — ESCITALOPRAM OXALATE 5 MG PO TABS: 5.0000 mg | ORAL_TABLET | Freq: Every day | ORAL | Status: DC
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
## 2014-08-13 ENCOUNTER — Other Ambulatory Visit: Payer: Self-pay | Admitting: *Deleted

## 2014-08-13 ENCOUNTER — Other Ambulatory Visit: Payer: Self-pay | Admitting: Internal Medicine

## 2014-08-13 MED ORDER — METFORMIN HCL ER (MOD) 500 MG PO TB24: 500.0000 mg | ORAL_TABLET | Freq: Two times a day (BID) | ORAL | Status: DC

## 2014-09-11 ENCOUNTER — Ambulatory Visit (HOSPITAL_COMMUNITY): Payer: Self-pay | Admitting: Psychiatry

## 2014-09-22 ENCOUNTER — Encounter (INDEPENDENT_AMBULATORY_CARE_PROVIDER_SITE_OTHER): Payer: Self-pay

## 2014-09-22 ENCOUNTER — Encounter (HOSPITAL_COMMUNITY): Payer: Self-pay | Admitting: Psychiatry

## 2014-09-22 ENCOUNTER — Ambulatory Visit (INDEPENDENT_AMBULATORY_CARE_PROVIDER_SITE_OTHER): Payer: No Typology Code available for payment source | Admitting: Psychiatry

## 2014-09-22 VITALS — BP 128/80 | HR 85 | Ht 67.0 in | Wt 318.0 lb

## 2014-09-22 DIAGNOSIS — F410 Panic disorder [episodic paroxysmal anxiety] without agoraphobia: Secondary | ICD-10-CM | POA: Diagnosis not present

## 2014-09-22 DIAGNOSIS — F331 Major depressive disorder, recurrent, moderate: Secondary | ICD-10-CM

## 2014-09-22 MED ORDER — ESCITALOPRAM OXALATE 5 MG PO TABS: 5.0000 mg | ORAL_TABLET | Freq: Every day | ORAL | Status: DC
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
## 2014-10-30 ENCOUNTER — Other Ambulatory Visit: Payer: Self-pay | Admitting: Family Medicine

## 2014-11-05 ENCOUNTER — Encounter: Payer: Self-pay | Admitting: Internal Medicine

## 2014-11-05 NOTE — Progress Notes (Signed)
Subjective:    Patient ID: Jessica Castro, female    DOB: 1986-05-15, 29 y.o.   MRN: [id number]  HPI 08/11/2014  Psychiatry note  Treatment Plan and Summary: Decrease Lexapro to 5 mg.  Add a mood stabilizer like gabapentin 100 mg 1-2 tablets during the day or at night she prefers taking at night discussed sleep hygiene and she has a sleep evaluation scheduled today. Pertinent Labs and Relevant Prior Notes reviewed. Medication Side effects, benefits and risks reviewed/discussed with Patient. Time given for patient to respond and asks questions regarding the Diagnosis and Medications. Safety concerns and to report to ER if suicidal or call 911. Relevant Medications refilled or called in to pharmacy. Discussed weight maintenance and Sleep Hygiene. Follow up with Primary care provider in regards to Medical conditions. Recommend compliance with medications and follow up office appointments. Discussed to avail opportunity to consider or/and continue Individual therapy with Counselor. Greater than 50% of time was spend in counseling and coordination of care with the patient.  Schedule for Follow up visit in 4 weeks or call in earlier as necessary.   Jessica Ross, MD 08/11/2014  TODAY  Jessica Castro is here for follow up  Allergies  Allergen Reactions  . Peanuts [Peanut Oil]    Past Medical History  Diagnosis Date  . Asthma   . History of chicken pox     childhood  . Depression     treated with anxiety- current  counseling   . History of bronchitis   . History of headache   . GERD (gastroesophageal reflux disease)   . Allergic rhinitis     Allergy- shots with Dr Sharyn Lull  . History of migraines   . Herniated disc     back  . Menorrhagia   . Anxiety     has a history of panic disorder  . Amenorrhea    Past Surgical History  Procedure Laterality Date  . Wisdom tooth extraction  2003   History   Social History  . Marital Status: Single    Spouse Name: N/A  . Number of  Children: N/A  . Years of Education: N/A   Occupational History  . Not on file.   Social History Main Topics  . Smoking status: Never Smoker   . Smokeless tobacco: Never Used  . Alcohol Use: 0.0 - 1.0 oz/week    0-2 Standard drinks or equivalent per week     Comment: social  . Drug Use: No  . Sexual Activity:    Partners: Male    Birth Control/ Protection: Inserts   Other Topics Concern  . Not on file   Social History Narrative   Family History  Problem Relation Age of Onset  . Alcohol abuse Mother   . Hyperlipidemia Mother   . Heart disease Mother   . Anxiety disorder Mother   . Hyperlipidemia Father   . Diabetes Father   . Hyperlipidemia Maternal Grandmother   . Alzheimer's disease Maternal Grandmother   . Pancreatic cancer Paternal Grandfather   . Stroke Maternal Grandfather   . Alcohol abuse Maternal Grandfather   . Anxiety disorder Sister    Patient Active Problem List   Diagnosis Date Noted  . PCOS (polycystic ovarian syndrome) 08/06/2014  . Bilateral ovarian cysts per GYN 01/2014 03/06/2014  . Onychocryptosis 07/23/2013  . Anxiety and depression 07/14/2013  . GERD (gastroesophageal reflux disease) 07/14/2013  . Morbid obesity 07/14/2013  . Herniated lumbar disc without myelopathy 07/14/2013  . Sinusitis 07/01/2013  .  Paronychia of toe of right foot 07/01/2013  . Urinary frequency 05/09/2012  . Hemorrhoids 06/02/2011  . Constipation 03/21/2011  . Lumbar back pain 03/21/2011  . Extrinsic asthma 03/21/2011  . Allergic rhinitis 03/21/2011  . Migraine 03/21/2011   Current Outpatient Prescriptions on File Prior to Visit  Medication Sig Dispense Refill  . albuterol (PROVENTIL HFA;VENTOLIN HFA) 108 (90 BASE) MCG/ACT inhaler Inhale 2 inhaltions tid for wheezing.  Use 2 inhlations prior to exercise 1 Inhaler 0  . cetirizine (ZYRTEC) 10 MG tablet Take 10 mg by mouth daily.      . clonazePAM (KLONOPIN) 0.5 MG tablet Take one as needed daily or at hs 15 tablet 0    . escitalopram (LEXAPRO) 5 MG tablet Take 1 tablet (5 mg total) by mouth daily. 30 tablet 1  . fluticasone (FLONASE) 50 MCG/ACT nasal spray Place 2 sprays into both nostrils daily. 16 g 6  . metFORMIN (GLUMETZA) 500 MG (MOD) 24 hr tablet Take 1 tablet (500 mg total) by mouth 2 (two) times daily with a meal. (Patient not taking: Reported on 09/22/2014) 60 tablet 1  . montelukast (SINGULAIR) 10 MG tablet Take 1 tablet (10 mg total) by mouth daily. PATIENT NEEDS OFFICE VISIT FOR ADDITIONAL REFILLS 30 tablet 0  . Norethindrone Acetate-Ethinyl Estrad-FE (LOESTRIN 24 FE) 1-20 MG-MCG(24) tablet Take 1 tablet by mouth daily. 3 Package 4  . OMEPRAZOLE PO Take 1 tablet by mouth daily.       No current facility-administered medications on file prior to visit.       Review of Systems    see HPI Objective:   Physical Exam Physical Exam  Nursing note and vitals reviewed.  Constitutional: She is oriented to person, place, and time. She appears well-developed and well-nourished.  HENT:  Head: Normocephalic and atraumatic.  Cardiovascular: Normal rate and regular rhythm. Exam reveals no gallop and no friction rub.  No murmur heard.  Pulmonary/Chest: Breath sounds normal. She has no wheezes. She has no rales.  Neurological: She is alert and oriented to person, place, and time.  Skin: Skin is warm and dry.  Psychiatric: She has a normal mood and affect. Her behavior is normal.         Assessment & Plan:    MDD with panic disorder  Managed by Psychiatry

## 2014-11-06 ENCOUNTER — Encounter (HOSPITAL_COMMUNITY): Payer: Self-pay | Admitting: Psychiatry

## 2014-11-06 ENCOUNTER — Ambulatory Visit (INDEPENDENT_AMBULATORY_CARE_PROVIDER_SITE_OTHER): Payer: No Typology Code available for payment source | Admitting: Psychiatry

## 2014-11-06 VITALS — BP 122/72 | HR 84 | Ht 67.0 in | Wt 317.0 lb

## 2014-11-06 DIAGNOSIS — F331 Major depressive disorder, recurrent, moderate: Secondary | ICD-10-CM | POA: Diagnosis not present

## 2014-11-06 DIAGNOSIS — F410 Panic disorder [episodic paroxysmal anxiety] without agoraphobia: Secondary | ICD-10-CM | POA: Diagnosis not present

## 2014-11-06 MED ORDER — ESCITALOPRAM OXALATE 5 MG PO TABS: 5.0000 mg | ORAL_TABLET | Freq: Every day | ORAL | Status: DC
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
## 2015-01-08 ENCOUNTER — Ambulatory Visit (HOSPITAL_COMMUNITY): Payer: Self-pay | Admitting: Psychiatry

## 2015-01-27 ENCOUNTER — Telehealth: Payer: Self-pay | Admitting: Certified Nurse Midwife

## 2015-01-27 NOTE — Telephone Encounter (Signed)
Patient would like to come in for std testing.

## 2015-01-27 NOTE — Telephone Encounter (Signed)
Spoke with patient. Patient states that she is concerned about STD exposure. Woke up today with a sore throat and is worried. Denies any vaginal symptoms, fevers, chills, or cough. Patient requesting appointment to be seen tomorrow for STD testing. Appointment scheduled for tomorrow at 10 am with Verner Choleborah S. Leonard CNM. Patient is agreeable to date and time.  Routing to provider for final review. Patient agreeable to disposition. Will close encounter.   Patient aware provider will review message and nurse will return call if any additional advice or change of disposition.

## 2015-01-28 ENCOUNTER — Ambulatory Visit (INDEPENDENT_AMBULATORY_CARE_PROVIDER_SITE_OTHER): Payer: PRIVATE HEALTH INSURANCE | Admitting: Certified Nurse Midwife

## 2015-01-28 ENCOUNTER — Encounter: Payer: Self-pay | Admitting: Certified Nurse Midwife

## 2015-01-28 ENCOUNTER — Telehealth: Payer: Self-pay | Admitting: Certified Nurse Midwife

## 2015-01-28 VITALS — BP 118/70 | HR 72 | Resp 16 | Ht 67.0 in | Wt 321.0 lb

## 2015-01-28 DIAGNOSIS — Z113 Encounter for screening for infections with a predominantly sexual mode of transmission: Secondary | ICD-10-CM
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
## 2015-01-28 NOTE — Telephone Encounter (Signed)
Patient was seen today and forgot to ask for a Metformin prescription. Confirmed pharmacy on file.

## 2015-01-28 NOTE — Patient Instructions (Signed)

## 2015-01-28 NOTE — Telephone Encounter (Signed)
Debbi,  Can you review and advise. Patient on Glumetza. Per notes from Dr. Ebbie LatusSchoenoff patient could not tolerate Metformin due to diarrhea side effects. Patient reports history of PCOS per Dr. April MansonYalcinkaya in Dr. Ebbie LatusSchoenoff notes.

## 2015-01-29 LAB — STD PANEL
HIV 1&2 Ab, 4th Generation: NONREACTIVE
Hepatitis B Surface Ag: NEGATIVE

## 2015-01-29 LAB — HEPATITIS C ANTIBODY: HCV Ab: NEGATIVE

## 2015-01-29 LAB — HSV(HERPES SIMPLEX VRS) I + II AB-IGG: HSV 1 Glycoprotein G Ab, IgG: <0.1 IV

## 2015-01-29 NOTE — Progress Notes (Signed)
Reviewed personally.  M. Suzanne Keavon Sensing, MD.  

## 2015-01-29 NOTE — Telephone Encounter (Signed)
My understanding was her medication was coming from her endocrine. Can we check on this.

## 2015-01-30 LAB — IPS N GONORRHOEA AND CHLAMYDIA BY PCR

## 2015-02-01 LAB — GONOCOCCUS CULTURE

## 2015-02-03 LAB — CHLAMYDIA CULTURE: Source: NEGATIVE

## 2015-02-03 MED ORDER — METFORMIN HCL ER (MOD) 500 MG PO TB24: 500.0000 mg | ORAL_TABLET | Freq: Every day | ORAL | Status: DC

## 2015-02-03 NOTE — Telephone Encounter (Signed)
Call to patient to advise. She is agreeable. [facility].  Patient confirms she takes one tablet per day only. Does not take two tablets due to side effects.  Routing to provider for final review. Patient agreeable to disposition. Will close encounter.

## 2015-02-03 NOTE — Telephone Encounter (Signed)
Will refill for one month only

## 2015-02-03 NOTE — Telephone Encounter (Signed)
Patient states she does not see endocrinology any longer. She has obtained new PCP for 02/13/15 and requests fill until she can have PCP write for new prescription.  Advised would send her request to provider.

## 2015-02-04 ENCOUNTER — Telehealth: Payer: Self-pay

## 2015-02-04 NOTE — Telephone Encounter (Signed)
lmtcb

## 2015-02-04 NOTE — Telephone Encounter (Signed)
-----   Message from Verner Choleborah S Leonard, CNM sent at 02/03/2015  5:17 PM EDT ----- Notify patient that HSV 1,2, HIV, RPR, Hep B,C, GC,Chlamydia both vaginal and oral area negative

## 2015-02-04 NOTE — Telephone Encounter (Signed)
Patient notified of results. See lab 

## 2015-02-11 ENCOUNTER — Ambulatory Visit (INDEPENDENT_AMBULATORY_CARE_PROVIDER_SITE_OTHER): Payer: PRIVATE HEALTH INSURANCE | Admitting: Psychiatry

## 2015-02-11 ENCOUNTER — Encounter (HOSPITAL_COMMUNITY): Payer: Self-pay | Admitting: Psychiatry

## 2015-02-11 VITALS — BP 124/64 | HR 94 | Ht 67.0 in | Wt 320.0 lb

## 2015-02-11 DIAGNOSIS — F331 Major depressive disorder, recurrent, moderate: Secondary | ICD-10-CM | POA: Diagnosis not present

## 2015-02-11 DIAGNOSIS — F988 Other specified behavioral and emotional disorders with onset usually occurring in childhood and adolescence: Secondary | ICD-10-CM

## 2015-02-11 DIAGNOSIS — F410 Panic disorder [episodic paroxysmal anxiety] without agoraphobia: Secondary | ICD-10-CM

## 2015-02-11 MED ORDER — ESCITALOPRAM OXALATE 5 MG PO TABS: 5.0000 mg | ORAL_TABLET | Freq: Every day | ORAL | Status: DC
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
## 2015-02-13 ENCOUNTER — Ambulatory Visit (HOSPITAL_COMMUNITY): Payer: Self-pay | Admitting: Psychiatry

## 2015-02-24 ENCOUNTER — Ambulatory Visit: Payer: Self-pay | Admitting: Certified Nurse Midwife

## 2015-03-09 ENCOUNTER — Encounter: Payer: Self-pay | Admitting: Internal Medicine

## 2015-03-31 ENCOUNTER — Ambulatory Visit (INDEPENDENT_AMBULATORY_CARE_PROVIDER_SITE_OTHER): Payer: 59 | Admitting: Certified Nurse Midwife

## 2015-03-31 ENCOUNTER — Encounter: Payer: Self-pay | Admitting: Certified Nurse Midwife

## 2015-03-31 VITALS — BP 102/64 | HR 68 | Resp 16 | Ht 66.75 in | Wt 315.0 lb

## 2015-03-31 DIAGNOSIS — N390 Urinary tract infection, site not specified: Secondary | ICD-10-CM

## 2015-03-31 DIAGNOSIS — Z01419 Encounter for gynecological examination (general) (routine) without abnormal findings: Secondary | ICD-10-CM | POA: Diagnosis not present

## 2015-03-31 DIAGNOSIS — Z0000 Encounter for general adult medical examination without abnormal findings: Secondary | ICD-10-CM

## 2015-03-31 DIAGNOSIS — N912 Amenorrhea, unspecified: Secondary | ICD-10-CM

## 2015-03-31 DIAGNOSIS — Z3041 Encounter for surveillance of contraceptive pills: Secondary | ICD-10-CM | POA: Diagnosis not present

## 2015-03-31 LAB — POCT URINALYSIS DIPSTICK
Bilirubin, UA: NEGATIVE
Blood, UA: NEGATIVE
GLUCOSE UA: NEGATIVE
Ketones, UA: NEGATIVE
Nitrite, UA: NEGATIVE
PROTEIN UA: NEGATIVE
Urobilinogen, UA: NEGATIVE
pH, UA: 5

## 2015-03-31 LAB — POCT URINE PREGNANCY: Preg Test, Ur: NEGATIVE

## 2015-03-31 MED ORDER — NORETHIN ACE-ETH ESTRAD-FE 1-20 MG-MCG(24) PO TABS: 1.0000 | ORAL_TABLET | Freq: Every day | ORAL | Status: DC

## 2015-03-31 MED ORDER — PHENAZOPYRIDINE HCL 100 MG PO TABS: 100.0000 mg | ORAL_TABLET | Freq: Three times a day (TID) | ORAL | Status: DC | PRN

## 2015-03-31 MED ORDER — NITROFURANTOIN MONOHYD MACRO 100 MG PO CAPS: 100.0000 mg | ORAL_CAPSULE | Freq: Two times a day (BID) | ORAL | Status: DC

## 2015-03-31 NOTE — Progress Notes (Signed)
29 y.o. G0P0000 Single  Caucasian Fe here for annual exam. Periods normal with OCP use with occasional missed period. Working on losing weight with now 10 pounds down. Patient stressed that she has not started her period and is having some constipation. No partner change or STD screening needed. Still using marijuana occasionally for anxiety.Sees PCP Dr.Wong for medication management and glucose(PCOS) control/ labs. Sees Dr.Aktar for psychiatrist for Lexapro management, stable medication now. Some low pelvic cramping, missed last period, but on OCP, with negative UPT. Complaining of urinary frequency and some urgency over the past few weeks. Some bloating also. No dysuria. No back pain or nausea, fever or chills. Very busy with graduate school and work. One more year! No other concerns today.   Patient's last menstrual period was 02/13/2015.          Sexually active: Yes.    The current method of family planning is OCP (estrogen/progesterone).    Exercising: Yes.    strength & cardio Smoker:  No but marijuana use  Health Maintenance: Pap: 02-18-14 neg MMG:  none Colonoscopy:  none BMD:   none TDaP:  2015 Labs: upt-neg, poct urine-wbc 2+ Self breast exam: done occ   reports that she has never smoked. She has never used smokeless tobacco. She reports that she drinks about 0.6 - 1.2 oz of alcohol per week. She reports that she uses illicit drugs (Marijuana).  Past Medical History  Diagnosis Date  . Asthma   . History of chicken pox     childhood  . Depression     treated with anxiety- current  counseling   . History of bronchitis   . History of headache   . GERD (gastroesophageal reflux disease)   . Allergic rhinitis     Allergy- shots with Dr Sharyn Lull  . History of migraines   . Herniated disc     back  . Menorrhagia   . Anxiety     has a history of panic disorder  . Amenorrhea   . PCOS (polycystic ovarian syndrome)     Past Surgical History  Procedure Laterality Date  . Wisdom  tooth extraction  2003    Current Outpatient Prescriptions  Medication Sig Dispense Refill  . albuterol (PROVENTIL HFA;VENTOLIN HFA) 108 (90 BASE) MCG/ACT inhaler Inhale 2 inhaltions tid for wheezing.  Use 2 inhlations prior to exercise 1 Inhaler 0  . cetirizine (ZYRTEC) 10 MG tablet Take 10 mg by mouth daily.      Marland Kitchen escitalopram (LEXAPRO) 5 MG tablet Take 1 tablet (5 mg total) by mouth daily. 30 tablet 3  . metFORMIN (GLUCOPHAGE-XR) 500 MG 24 hr tablet TK 1 T PO D  3  . montelukast (SINGULAIR) 10 MG tablet Take 1 tablet (10 mg total) by mouth daily. PATIENT NEEDS OFFICE VISIT FOR ADDITIONAL REFILLS 30 tablet 0  . Norethindrone Acetate-Ethinyl Estrad-FE (LOESTRIN 24 FE) 1-20 MG-MCG(24) tablet Take 1 tablet by mouth daily. 1 Package 12  . OMEPRAZOLE PO Take 1 tablet by mouth daily.      . fluticasone (FLONASE) 50 MCG/ACT nasal spray Place 2 sprays into both nostrils daily. (Patient not taking: Reported on 03/31/2015) 16 g 6   No current facility-administered medications for this visit.    Family History  Problem Relation Age of Onset  . Alcohol abuse Mother   . Hyperlipidemia Mother   . Heart disease Mother   . Anxiety disorder Mother   . Hyperlipidemia Father   . Diabetes Father   . Hyperlipidemia  Maternal Grandmother   . Alzheimer's disease Maternal Grandmother   . Pancreatic cancer Paternal Grandfather   . Stroke Maternal Grandfather   . Alcohol abuse Maternal Grandfather   . Anxiety disorder Sister     ROS:  Pertinent items are noted in HPI.  Otherwise, a comprehensive ROS was negative.  Exam:   BP 102/64 mmHg  Pulse 68  Resp 16  Ht 5' 6.75" (1.695 m)  Wt 315 lb (142.883 kg)  BMI 49.73 kg/m2  LMP 02/13/2015 Height: 5' 6.75" (169.5 cm) Ht Readings from Last 3 Encounters:  03/31/15 5' 6.75" (1.695 m)  02/11/15  (1.702 m)  01/28/15  (1.702 m)    General appearance: alert, cooperative and appears stated age Head: Normocephalic, without obvious abnormality,  atraumatic Neck: no adenopathy, supple, symmetrical, trachea midline and thyroid normal to inspection and palpation Lungs: clear to auscultation bilaterally CVAT: negative bilateral Breasts: normal appearance, no masses or tenderness, No nipple retraction or dimpling, No nipple discharge or bleeding, No axillary or supraclavicular adenopathy Heart: regular rate and rhythm Abdomen: soft, non-tender; no masses,  no organomegaly, positive suprapubic Extremities: extremities normal, atraumatic, no cyanosis or edema Skin: Skin color, texture, turgor normal. No rashes or lesions, warm and dry Lymph nodes: Cervical, supraclavicular, and axillary nodes normal. No abnormal inguinal nodes palpated Neurologic: Grossly normal   Pelvic: External genitalia:  no lesions              Urethra:  normal appearing urethra with no masses, tender, no lesions  Bladder and urethral meatus tender              Bartholin's and Skene's: normal                 Vagina: normal appearing vagina with normal color and discharge, no lesions              Cervix: normal,non tender, no lesions              Pap taken: No. Bimanual Exam:  Uterus:  normal, no large masses difficult to palpate due to body habitus              Adnexa: no mass, fullness, tenderness, adnexa not palpated but no large masses               Rectovaginal: Confirms               Anus:  normal appearance   Chaperone present: yes  A:  Well Woman with normal exam with limited pelvic exam due to obesity  Contraception OCP with amenorrhea this cycle only with negative UPT  UTI  Morbid obesity on weight loss  History of PCOS with PCP management of medication  Psychiatrist management of Lexapro stable medication  P:   Reviewed health and wellness pertinent to exam. Discussed limited pelvic exam due to body habitus and PUS recommended every 2 years, patient had 2 last year, so none needed this year.  Rx Junel 1/20 Fe, discussed amenorrhea with consistent  OCP and not a concern  if not consistent would need to do UPT and would be concern for no protection from pregnancy. Will advise if concerns.  Discussed finding of UTI consistent with symptoms and exam.  Rx Macrobid with instructions see order  Rx Pyridium see order  Continue MD follow up as indicated.  Continue her journey with weight loss, keep up the good work!  Pap smear as above not taken today.   counseled on  breast self exam, STD prevention, HIV risk factors and prevention, use and side effects of OCP's, adequate intake of calcium and vitamin D, diet and exercise  return annually or prn  An After Visit Summary was printed and given to the patient.

## 2015-03-31 NOTE — Patient Instructions (Signed)
General topics  Next pap or exam is  due in 1 year Take a Women's multivitamin Take 1200 mg. of calcium daily - prefer dietary If any concerns in interim to call back  Breast Self-Awareness Practicing breast self-awareness may pick up problems early, prevent significant medical complications, and possibly save your life. By practicing breast self-awareness, you can become familiar with how your breasts look and feel and if your breasts are changing. This allows you to notice changes early. It can also offer you some reassurance that your breast health is good. One way to learn what is normal for your breasts and whether your breasts are changing is to do a breast self-exam. If you find a lump or something that was not present in the past, it is best to contact your caregiver right away. Other findings that should be evaluated by your caregiver include nipple discharge, especially if it is bloody; skin changes or reddening; areas where the skin seems to be pulled in (retracted); or new lumps and bumps. Breast pain is seldom associated with cancer (malignancy), but should also be evaluated by a caregiver. BREAST SELF-EXAM The best time to examine your breasts is 5 7 days after your menstrual period is over.  ExitCare Patient Information 2013 [employer].   Exercise to Stay Healthy Exercise helps you become and stay healthy. EXERCISE IDEAS AND TIPS Choose exercises that:  You enjoy.  Fit into your day. You do not need to exercise really hard to be healthy. You can do exercises at a slow or medium level and stay healthy. You can:  Stretch before and after working out.  Try yoga, Pilates, or tai chi.  Lift weights.  Walk fast, swim, jog, run, climb stairs, bicycle, dance, or rollerskate.  Take aerobic classes. Exercises that burn about 150 calories:  Running 1  miles in 15 minutes.  Playing volleyball for 45 to 60 minutes.  Washing and waxing a car for 45 to 60  minutes.  Playing touch football for 45 minutes.  Walking 1  miles in 35 minutes.  Pushing a stroller 1  miles in 30 minutes.  Playing basketball for 30 minutes.  Raking leaves for 30 minutes.  Bicycling 5 miles in 30 minutes.  Walking 2 miles in 30 minutes.  Dancing for 30 minutes.  Shoveling snow for 15 minutes.  Swimming laps for 20 minutes.  Walking up stairs for 15 minutes.  Bicycling 4 miles in 15 minutes.  Gardening for 30 to 45 minutes.  Jumping rope for 15 minutes.  Washing windows or floors for 45 to 60 minutes. Document Released: 08/20/2010 Document Revised: 10/10/2011 Document Reviewed: 08/20/2010 ExitCare Patient Information 2013 [employer].   Other topics ( that may be useful information):    Sexually Transmitted Disease Sexually transmitted disease (STD) refers to any infection that is passed from person to person during sexual activity. This may happen by way of saliva, semen, blood, vaginal mucus, or urine. Common STDs include:  Gonorrhea.  Chlamydia.  Syphilis.  HIV/AIDS.  Genital herpes.  Hepatitis B and C.  Trichomonas.  Human papillomavirus (HPV).  Pubic lice. CAUSES  An STD may be spread by bacteria, virus, or parasite. A person can get an STD by:  Sexual intercourse with an infected person.  Sharing sex toys with an infected person.  Sharing needles with an infected person.  Having intimate contact with the genitals, mouth, or rectal areas of an infected person. SYMPTOMS  Some people may not have any symptoms, but   they can still pass the infection to others. Different STDs have different symptoms. Symptoms include:  Painful or bloody urination.  Pain in the pelvis, abdomen, vagina, anus, throat, or eyes.  Skin rash, itching, irritation, growths, or sores (lesions). These usually occur in the genital or anal area.  Abnormal vaginal discharge.  Penile discharge in men.  Soft, flesh-colored skin growths in the  genital or anal area.  Fever.  Pain or bleeding during sexual intercourse.  Swollen glands in the groin area.  Yellow skin and eyes (jaundice). This is seen with hepatitis. DIAGNOSIS  To make a diagnosis, your caregiver may:  Take a medical history.  Perform a physical exam.  Take a specimen (culture) to be examined.  Examine a sample of discharge under a microscope.  Perform blood test TREATMENT   Chlamydia, gonorrhea, trichomonas, and syphilis can be cured with antibiotic medicine.  Genital herpes, hepatitis, and HIV can be treated, but not cured, with prescribed medicines. The medicines will lessen the symptoms.  Genital warts from HPV can be treated with medicine or by freezing, burning (electrocautery), or surgery. Warts may come back.  HPV is a virus and cannot be cured with medicine or surgery.However, abnormal areas may be followed very closely by your caregiver and may be removed from the cervix, vagina, or vulva through office procedures or surgery. If your diagnosis is confirmed, your recent sexual partners need treatment. This is true even if they are symptom-free or have a negative culture or evaluation. They should not have sex until their caregiver says it is okay. HOME CARE INSTRUCTIONS  All sexual partners should be informed, tested, and treated for all STDs.  Take your antibiotics as directed. Finish them even if you start to feel better.  Only take over-the-counter or prescription medicines for pain, discomfort, or fever as directed by your caregiver.  Rest.  Eat a balanced diet and drink enough fluids to keep your urine clear or pale yellow.  Do not have sex until treatment is completed and you have followed up with your caregiver. STDs should be checked after treatment.  Keep all follow-up appointments, Pap tests, and blood tests as directed by your caregiver.  Only use latex condoms and water-soluble lubricants during sexual activity. Do not use  petroleum jelly or oils.  Avoid alcohol and illegal drugs.  Get vaccinated for HPV and hepatitis. If you have not received these vaccines in the past, talk to your caregiver about whether one or both might be right for you.  Avoid risky sex practices that can break the skin. The only way to avoid getting an STD is to avoid all sexual activity.Latex condoms and dental dams (for oral sex) will help lessen the risk of getting an STD, but will not completely eliminate the risk. SEEK MEDICAL CARE IF:   You have a fever.  You have any new or worsening symptoms. Document Released: 10/08/2002 Document Revised: 10/10/2011 Document Reviewed: 10/15/2010 [facility] Patient Information 2013 [employer].    Domestic Abuse You are being battered or abused if someone close to you hits, pushes, or physically hurts you in any way. You also are being abused if you are forced into activities. You are being sexually abused if you are forced to have sexual contact of any kind. You are being emotionally abused if you are made to feel worthless or if you are constantly threatened. It is important to remember that help is available. No one has the right to abuse you. PREVENTION OF FURTHER  ABUSE  Learn the warning signs of danger. This varies with situations but may include: the use of alcohol, threats, isolation from friends and family, or forced sexual contact. Leave if you feel that violence is going to occur.  If you are attacked or beaten, report it to the police so the abuse is documented. You do not have to press charges. The police can protect you while you or the attackers are leaving. Get the officer's name and badge number and a copy of the report.  Find someone you can trust and tell them what is happening to you: your caregiver, a nurse, clergy member, close friend or family member. Feeling ashamed is natural, but remember that you have done nothing wrong. No one deserves abuse. Document Released:  07/15/2000 Document Revised: 10/10/2011 Document Reviewed: 09/23/2010 ExitCare Patient Information 2013 [employer].    How Much is Too Much Alcohol? Drinking too much alcohol can cause injury, accidents, and health problems. These types of problems can include:   Car crashes.  Falls.  Family fighting (domestic violence).  Drowning.  Fights.  Injuries.  Burns.  Damage to certain organs.  Having a baby with birth defects. ONE DRINK CAN BE TOO MUCH WHEN YOU ARE:  Working.  Pregnant or breastfeeding.  Taking medicines. Ask your doctor.  Driving or planning to drive. If you or someone you know has a drinking problem, get help from a doctor.  Document Released: 05/14/2009 Document Revised: 10/10/2011 Document Reviewed: 05/14/2009 ExitCare Patient Information 2013 [employer].   Smoking Hazards Smoking cigarettes is extremely bad for your health. Tobacco smoke has over 200 known poisons in it. There are over 60 chemicals in tobacco smoke that cause cancer. Some of the chemicals found in cigarette smoke include:   Cyanide.  Benzene.  Formaldehyde.  Methanol (wood alcohol).  Acetylene (fuel used in welding torches).  Ammonia. Cigarette smoke also contains the poisonous gases nitrogen oxide and carbon monoxide.  Cigarette smokers have an increased risk of many serious medical problems and Smoking causes approximately:  90% of all lung cancer deaths in men.  80% of all lung cancer deaths in women.  90% of deaths from chronic obstructive lung disease. Compared with nonsmokers, smoking increases the risk of:  Coronary heart disease by 2 to 4 times.  Stroke by 2 to 4 times.  Men developing lung cancer by 23 times.  Women developing lung cancer by 13 times.  Dying from chronic obstructive lung diseases by 12 times.  . Smoking is the most preventable cause of death and disease in our society.  WHY IS SMOKING ADDICTIVE?  Nicotine is the chemical  agent in tobacco that is capable of causing addiction or dependence.  When you smoke and inhale, nicotine is absorbed rapidly into the bloodstream through your lungs. Nicotine absorbed through the lungs is capable of creating a powerful addiction. Both inhaled and non-inhaled nicotine may be addictive.  Addiction studies of cigarettes and spit tobacco show that addiction to nicotine occurs mainly during the teen years, when young people begin using tobacco products. WHAT ARE THE BENEFITS OF QUITTING?  There are many health benefits to quitting smoking.   Likelihood of developing cancer and heart disease decreases. Health improvements are seen almost immediately.  Blood pressure, pulse rate, and breathing patterns start returning to normal soon after quitting. QUITTING SMOKING   American Lung Association - 1-800-LUNGUSA  American Cancer Society - 1-800-ACS-2345 Document Released: 08/25/2004 Document Revised: 10/10/2011 Document Reviewed: 04/29/2009 ExitCare Patient Information 2013 ExitCare[employer]   LL[employer].   Stress Management Stress is a state of physical or mental tension that often results from changes in your life or normal routine. Some common causes of stress are:  Death of a loved one.  Injuries or severe illnesses.  Getting fired or changing jobs.  Moving into a new home. Other causes may be:  Sexual problems.  Business or financial losses.  Taking on a large debt.  Regular conflict with someone at home or at work.  Constant tiredness from lack of sleep. It is not just bad things that are stressful. It may be stressful to:  Win the lottery.  Get married.  Buy a new car. The amount of stress that can be easily tolerated varies from person to person. Changes generally cause stress, regardless of the types of change. Too much stress can affect your health. It may lead to physical or emotional problems. Too little stress (boredom) may also become stressful. SUGGESTIONS TO  REDUCE STRESS:  Talk things over with your family and friends. It often is helpful to share your concerns and worries. If you feel your problem is serious, you may want to get help from a professional counselor.  Consider your problems one at a time instead of lumping them all together. Trying to take care of everything at once may seem impossible. List all the things you need to do and then start with the most important one. Set a goal to accomplish 2 or 3 things each day. If you expect to do too many in a single day you will naturally fail, causing you to feel even more stressed.  Do not use alcohol or drugs to relieve stress. Although you may feel better for a short time, they do not remove the problems that caused the stress. They can also be habit forming.  Exercise regularly - at least 3 times per week. Physical exercise can help to relieve that "uptight" feeling and will relax you.  The shortest distance between despair and hope is often a good night's sleep.  Go to bed and get up on time allowing yourself time for appointments without being rushed.  Take a short "time-out" period from any stressful situation that occurs during the day. Close your eyes and take some deep breaths. Starting with the muscles in your face, tense them, hold it for a few seconds, then relax. Repeat this with the muscles in your neck, shoulders, hand, stomach, back and legs.  Take good care of yourself. Eat a balanced diet and get plenty of rest.  Schedule time for having fun. Take a break from your daily routine to relax. HOME CARE INSTRUCTIONS   Call if you feel overwhelmed by your problems and feel you can no longer manage them on your own.  Return immediately if you feel like hurting yourself or someone else. Document Released: 01/11/2001 Document Revised: 10/10/2011 Document Reviewed: 09/03/2007 ExitCare Patient Information 2013 [employer].   

## 2015-04-01 LAB — URINALYSIS, MICROSCOPIC ONLY
Casts: NONE SEEN [LPF]
Crystals: NONE SEEN [HPF]
Yeast: NONE SEEN [HPF]

## 2015-04-01 NOTE — Progress Notes (Signed)
Reviewed personally.  M. Suzanne Terita Hejl, MD.  

## 2015-04-02 LAB — URINE CULTURE

## 2015-04-08 ENCOUNTER — Telehealth: Payer: Self-pay | Admitting: Certified Nurse Midwife

## 2015-04-08 NOTE — Telephone Encounter (Signed)
Spoke with patient. Patient states that the Loestrin 24 Fe is not covered with her current insurance. Patient asking what she would do. Advised she will need to contact her insurance company to see what alternatives to Loestring 24 Fe are covered. Patient is agreeable. Will call pharmacy and return call to office.  Routing to provider for final review. Patient agreeable to disposition. Will close encounter.

## 2015-04-08 NOTE — Telephone Encounter (Signed)
Patient has questions for nurse regarding medication.

## 2015-05-12 ENCOUNTER — Encounter (HOSPITAL_COMMUNITY): Payer: Self-pay | Admitting: Psychiatry

## 2015-05-12 ENCOUNTER — Telehealth: Payer: Self-pay | Admitting: Certified Nurse Midwife

## 2015-05-12 ENCOUNTER — Ambulatory Visit (INDEPENDENT_AMBULATORY_CARE_PROVIDER_SITE_OTHER): Payer: 59 | Admitting: Psychiatry

## 2015-05-12 VITALS — BP 130/82 | HR 92 | Ht 67.0 in | Wt 322.0 lb

## 2015-05-12 DIAGNOSIS — F410 Panic disorder [episodic paroxysmal anxiety] without agoraphobia: Secondary | ICD-10-CM | POA: Diagnosis not present

## 2015-05-12 DIAGNOSIS — F988 Other specified behavioral and emotional disorders with onset usually occurring in childhood and adolescence: Secondary | ICD-10-CM

## 2015-05-12 DIAGNOSIS — F331 Major depressive disorder, recurrent, moderate: Secondary | ICD-10-CM | POA: Diagnosis not present

## 2015-05-12 MED ORDER — ESCITALOPRAM OXALATE 5 MG PO TABS: 5.0000 mg | ORAL_TABLET | Freq: Every day | ORAL | Status: DC

## 2015-05-12 NOTE — Telephone Encounter (Signed)
Annual exam with Lovett Sox, CNM on 03-31-15. RX for LoEstrin 24. Patient states LoEstrin 24 is too expensive. Checked with insurance and and microgestin is covered. Patient states she previously did not like Junel. Discussed that Junel 1/20/ Microgestin 1/20/Loestin 24 are all the same active ingredient. May not do as well with one generic as another, will have to chose to get at pharmacy that has the generic that she prefers. Patient states will not have any insurance in January and wonders what option available. Advised that there is a preferred $10 list at [facility] and [facility] which I think has Microgestin 1/20. Advised patient call to confirm this so we make only one change. Patient will call back in am. Ask for Kennon Rounds or French Ana.

## 2015-05-12 NOTE — Progress Notes (Signed)
Patient ID: Jessica Castro, female   DOB: 1986/05/25, 29 y.o.   MRN: [id number]  [facility] Follow up Outpatient visit   Jessica Castro [id number] 29 y.o.  05/12/2015 2:28 PM  Chief Complaint:  Depression and anxiety  History of Present Illness:   Patient Presents for follow up and medication management appointment for depression, adhd and anxiety. Initially "She has been referred by her primary care physician to a psychiatrist she has been taking Zoloft in the past for depression but apparently she was concerned about weight gain so she stopped it. Then she started seeing another psychiatrist Dr. Nolen Mu she was started on Wellbutrin but she was concerned about the stomach upset so she stopped that. Her provider did not like the fact that she has been stopping her medications this led to the concern that she wants to change the provider. 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 Patient is currently not working and is trying to focus in school. There has been some days that she was feeling down but overall she feels her anxiety level is more manageable considering she is not having job stress.  Sleep is manageable. Her inattention is not significant. She is trying to work on the future and may have to live with her parents while she is trying to finish her school  Modifying factors include friends and still she tries to keep herself busy.   No recent panic attacks  Severity of depression; 7 out of 10. 10 being no depression  No history of sexual trauma but physical trauma or difficult growing up as according to her that was very harsh and difficult to deal with. Her mom according to her was an alcoholic and also suffered from anxiety.   Past Psychiatric History/Hospitalization(s) Since 2012 she has been  on medications and has seen outpatient providers she continues seeing her outpatient provider as a therapist currently at [facility]. No psychiatric admissions or suicidal attempt before  Hospitalization for psychiatric illness: No   Medical History; Past Medical History  Diagnosis Date  . Asthma   . History of chicken pox     childhood  . Depression     treated with anxiety- current  counseling   . History of bronchitis   . History of headache   . GERD (gastroesophageal reflux disease)   . Allergic rhinitis     Allergy- shots with Dr Sharyn Lull  . History of migraines   . Herniated disc     back  . Menorrhagia   . Anxiety     has a history of panic disorder  . Amenorrhea   . PCOS (polycystic ovarian syndrome)     Allergies: Allergies  Allergen Reactions  . Peanuts [Peanut Oil]     Medications: Outpatient Encounter Prescriptions as of 05/12/2015  Medication Sig  . albuterol (PROVENTIL HFA;VENTOLIN HFA) 108 (90 BASE) MCG/ACT inhaler Inhale 2 inhaltions tid for wheezing.  Use 2 inhlations prior to exercise  . cetirizine (ZYRTEC) 10 MG tablet Take 10 mg by mouth daily.    Marland Kitchen escitalopram (LEXAPRO) 5 MG tablet Take 1 tablet (5 mg total) by mouth daily.  . metFORMIN (GLUCOPHAGE-XR) 500 MG 24 hr tablet TK 1 T PO D  . montelukast (SINGULAIR) 10 MG tablet Take 1 tablet (10 mg total) by mouth  daily. PATIENT NEEDS OFFICE VISIT FOR ADDITIONAL REFILLS  . nitrofurantoin, macrocrystal-monohydrate, (MACROBID) 100 MG capsule Take 1 capsule (100 mg total) by mouth 2 (two) times daily.  . Norethindrone Acetate-Ethinyl Estrad-FE (LOESTRIN 24 FE) 1-20 MG-MCG(24) tablet Take 1 tablet by mouth daily.  Marland Kitchen OMEPRAZOLE PO Take 1 tablet by mouth daily.    . phenazopyridine (PYRIDIUM) 100 MG tablet Take 1 tablet (100 mg total) by mouth 3 (three) times daily as needed for pain.  . [DISCONTINUED] escitalopram (LEXAPRO) 5 MG tablet Take 1 tablet (5 mg total) by mouth daily.   . fluticasone (FLONASE) 50 MCG/ACT nasal spray Place 2 sprays into both nostrils daily. (Patient not taking: Reported on 03/31/2015)   No facility-administered encounter medications on file as of 05/12/2015.   Family History; Family History  Problem Relation Age of Onset  . Alcohol abuse Mother   . Hyperlipidemia Mother   . Heart disease Mother   . Anxiety disorder Mother   . Hyperlipidemia Father   . Diabetes Father   . Hyperlipidemia Maternal Grandmother   . Alzheimer's disease Maternal Grandmother   . Pancreatic cancer Paternal Grandfather   . Stroke Maternal Grandfather   . Alcohol abuse Maternal Grandfather   . Anxiety disorder Sister        Labs:       Musculoskeletal: Strength & Muscle Tone: within normal limits Gait & Station: normal Patient leans: N/A  Mental Status Examination;   Psychiatric Specialty Exam: Physical Exam  Constitutional: No distress.  HENT:  Head: Normocephalic and atraumatic.  Skin: She is not diaphoretic.    Review of Systems  Constitutional: Negative for fever.  Respiratory: Negative for cough.   Cardiovascular: Negative for chest pain.  Skin: Negative for rash.  Psychiatric/Behavioral: Negative for depression and suicidal ideas. The patient is not nervous/anxious.     Blood pressure 130/82, pulse 92, height  (1.702 m), weight 322 lb (146.058 kg), SpO2 97 %.Body mass index is 50.42 kg/(m^2).  General Appearance: Casual, Neat and Well Groomed  Eye Contact::  Good  Speech:  Normal Rate  Volume:  Normal  Mood:  euthymic  Affect:  Congruent and Constricted  Thought Process:  Coherent and Intact  Orientation:  Full (Time, Place, and Person)  Thought Content:  Rumination  Suicidal Thoughts:  No  Homicidal Thoughts:  No  Memory:  Immediate;   Fair Recent;   Fair  Judgement:  Fair  Insight:  Fair  Psychomotor Activity:  Decreased  Concentration:  Fair  Recall:  Fair  Akathisia:  Negative  Handed:  Right  AIMS (if  indicated):     Assets:  Communication Skills Desire for Improvement Social Support Vocational/Educational  Sleep:        Assessment: Axis I: Major depressive disorder recurrent moderate. Panic disorder. R/o Bipolar II. Rule out ADhD  Axis II: Deferred  Axis III:  Past Medical History  Diagnosis Date  . Asthma   . History of chicken pox     childhood  . Depression     treated with anxiety- current  counseling   . History of bronchitis   . History of headache   . GERD (gastroesophageal reflux disease)   . Allergic rhinitis     Allergy- shots with Dr Sharyn Lull  . History of migraines   . Herniated disc     back  . Menorrhagia   . Anxiety     has a history of panic disorder  . Amenorrhea   . PCOS (polycystic ovarian  syndrome)     Axis IV: School and job stress   Treatment Plan and Summary: Continue Lexapro. Patient is not interested in a mood stabilizer she does not feel that she is dysfunctional.  Panic disorder:  Lexapro is also helping her panic symptoms and depression. ADHD ; baseline with no deterioration without medications Pertinent Labs and Relevant Prior Notes reviewed. Medication Side effects, benefits and risks reviewed/discussed with Patient. Time given for patient to respond and asks questions regarding the Diagnosis and Medications. Safety concerns and to report to ER if suicidal or call 911. Relevant Medications refilled or called in to pharmacy. Discussed weight maintenance and Sleep Hygiene. Follow up with Primary care provider in regards to Medical conditions. Recommend compliance with medications and follow up office appointments. Discussed to avail opportunity to consider or/and continue Individual therapy with Counselor. Greater than 50% of time was spend in counseling and coordination of care with the patient.  Schedule for Follow up visit in 8 weeks or call in earlier as necessary.  Time spent: 25 minutes   Thresa Ross, MD 05/12/2015

## 2015-05-12 NOTE — Telephone Encounter (Signed)
Patient called and stated, "My birth control is not covered by my insurance any more. My pharmacy recommended Microgestin or Lerin-FE as alternatives." Pharmacy on file is correct.

## 2015-05-13 MED ORDER — NORETHIN ACE-ETH ESTRAD-FE 1-20 MG-MCG PO TABS: 1.0000 | ORAL_TABLET | Freq: Every day | ORAL | Status: DC

## 2015-05-13 NOTE — Telephone Encounter (Signed)
yes

## 2015-05-13 NOTE — Telephone Encounter (Signed)
Order placed and detailed message left to advise okay per designated party release form.

## 2015-05-13 NOTE — Telephone Encounter (Signed)
Jessica Castro, Okay to place order for Junel FE 1/20?

## 2015-05-13 NOTE — Telephone Encounter (Signed)
Patient returning call. Would like the Junel 120 to be called in for her.

## 2015-07-28 ENCOUNTER — Encounter (HOSPITAL_COMMUNITY): Payer: Self-pay | Admitting: Psychiatry

## 2015-07-28 ENCOUNTER — Ambulatory Visit (INDEPENDENT_AMBULATORY_CARE_PROVIDER_SITE_OTHER): Payer: 59 | Admitting: Psychiatry

## 2015-07-28 DIAGNOSIS — F410 Panic disorder [episodic paroxysmal anxiety] without agoraphobia: Secondary | ICD-10-CM

## 2015-07-28 DIAGNOSIS — F331 Major depressive disorder, recurrent, moderate: Secondary | ICD-10-CM

## 2015-07-28 DIAGNOSIS — F909 Attention-deficit hyperactivity disorder, unspecified type: Secondary | ICD-10-CM | POA: Diagnosis not present

## 2015-07-28 DIAGNOSIS — F988 Other specified behavioral and emotional disorders with onset usually occurring in childhood and adolescence: Secondary | ICD-10-CM

## 2015-07-28 MED ORDER — ESCITALOPRAM OXALATE 10 MG PO TABS: 5.0000 mg | ORAL_TABLET | Freq: Every day | ORAL | Status: DC

## 2015-07-28 NOTE — Progress Notes (Signed)
Patient ID: Jessica PancoastKatie Cullom, female   DOB: Oct 11, 1985, 29 y.o.   MRN: [id number]  [facility] Follow up Outpatient visit   Jessica PancoastKatie Postma [id number] 29 y.o.  07/28/2015 9:13 AM  Chief Complaint:  Depression and anxiety  History of Present Illness:   Patient Presents for follow up and medication management appointment for depression, adhd and anxiety. Initially "She has been referred by her primary care physician to a psychiatrist she has been taking Zoloft in the past for depression but apparently she was concerned about weight gain so she stopped it. Then she started seeing another psychiatrist Dr. Nolen MuMcKinney she was started on Wellbutrin but she was concerned about the stomach upset so she stopped that. Her provider did not like the fact that she has been stopping her medications this led to the concern that she wants to change the provider. She's also been on BuSpar for anxiety and panic-like symptoms but apparently it did not help the depression and is causing nausea"  Tolerating medications . She prefers to keep lexapro dose low. She is loosing insurance and wants to delay her follow up appointments.   Sleep is manageable. Her inattention is not significant. She is trying to work on the future and may have to live with her parents while she is trying to finish her school Aggravating factor: was job stress. 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  Modifying factors include friends and still she tries to keep herself busy.   No recent panic attacks  Severity of depression; 7 out of 10. 10 being no depression  No history of sexual trauma but physical trauma or difficult growing up as according to her that was very harsh and difficult to deal with. Her mom according to her was an alcoholic and also suffered from anxiety. No psychosis  Past Psychiatric History/Hospitalization(s) Since 2012 she has been on medications and has seen outpatient providers she continues seeing her  outpatient provider as a therapist [facility]. No psychiatric admissions or suicidal attempt before  Hospitalization for psychiatric illness: No   Medical History; Past Medical History  Diagnosis Date  . Asthma   . History of chicken pox     childhood  . Depression     treated with anxiety- current  counseling   . History of bronchitis   . History of headache   . GERD (gastroesophageal reflux disease)   . Allergic rhinitis     Allergy- shots with Dr Sharyn LullKoslow  . History of migraines   . Herniated disc     back  . Menorrhagia   . Anxiety     has a history of panic disorder  . Amenorrhea   . PCOS (polycystic ovarian syndrome)     Allergies: Allergies  Allergen Reactions  . Peanuts [Peanut Oil]     Medications: Outpatient Encounter Prescriptions as of 07/28/2015  Medication Sig  . albuterol (PROVENTIL HFA;VENTOLIN HFA) 108 (90 BASE) MCG/ACT inhaler Inhale 2 inhaltions tid for wheezing.  Use 2 inhlations prior to exercise  . cetirizine (ZYRTEC) 10 MG tablet Take 10 mg by mouth daily.    Marland Kitchen. escitalopram (LEXAPRO) 10 MG tablet Take 0.5 tablets (5 mg total) by mouth daily.  . fluticasone (FLONASE) 50 MCG/ACT nasal spray Place 2 sprays into both nostrils daily. (Patient not taking: Reported on 03/31/2015)  . metFORMIN (GLUCOPHAGE-XR) 500 MG 24 hr tablet TK 1 T PO D  . montelukast (SINGULAIR) 10 MG tablet Take 1 tablet (10 mg  total) by mouth daily. PATIENT NEEDS OFFICE VISIT FOR ADDITIONAL REFILLS  . nitrofurantoin, macrocrystal-monohydrate, (MACROBID) 100 MG capsule Take 1 capsule (100 mg total) by mouth 2 (two) times daily.  . norethindrone-ethinyl estradiol (JUNEL FE,GILDESS FE,LOESTRIN FE) 1-20 MG-MCG tablet Take 1 tablet by mouth daily.  Marland Kitchen OMEPRAZOLE PO Take 1 tablet by mouth daily.    . phenazopyridine (PYRIDIUM) 100 MG tablet Take 1 tablet (100 mg total) by mouth 3 (three) times daily as needed for pain.  . [DISCONTINUED]  escitalopram (LEXAPRO) 5 MG tablet Take 1 tablet (5 mg total) by mouth daily.   No facility-administered encounter medications on file as of 07/28/2015.   Family History; Family History  Problem Relation Age of Onset  . Alcohol abuse Mother   . Hyperlipidemia Mother   . Heart disease Mother   . Anxiety disorder Mother   . Hyperlipidemia Father   . Diabetes Father   . Hyperlipidemia Maternal Grandmother   . Alzheimer's disease Maternal Grandmother   . Pancreatic cancer Paternal Grandfather   . Stroke Maternal Grandfather   . Alcohol abuse Maternal Grandfather   . Anxiety disorder Sister        Labs:       Musculoskeletal: Strength & Muscle Tone: within normal limits Gait & Station: normal Patient leans: N/A  Mental Status Examination;   Psychiatric Specialty Exam: Physical Exam  Constitutional: No distress.  HENT:  Head: Normocephalic and atraumatic.  Skin: She is not diaphoretic.    Review of Systems  Constitutional: Negative for fever.  Respiratory: Negative for cough.   Cardiovascular: Negative for palpitations.  Skin: Negative for rash.  Psychiatric/Behavioral: Negative for depression and suicidal ideas. The patient is not nervous/anxious.     There were no vitals taken for this visit.There is no weight on file to calculate BMI.  General Appearance: Casual, Neat and Well Groomed  Eye Contact::  Good  Speech:  Normal Rate  Volume:  Normal  Mood:  euthymic  Affect:  Congruent and Constricted  Thought Process:  Coherent and Intact  Orientation:  Full (Time, Place, and Person)  Thought Content:  Rumination  Suicidal Thoughts:  No  Homicidal Thoughts:  No  Memory:  Immediate;   Fair Recent;   Fair  Judgement:  Fair  Insight:  Fair  Psychomotor Activity:  Decreased  Concentration:  Fair  Recall:  Fair  Akathisia:  Negative  Handed:  Right  AIMS (if indicated):     Assets:  Communication Skills Desire for Improvement Social  Support Vocational/Educational  Sleep:        Assessment: Axis I: Major depressive disorder recurrent moderate. Panic disorder. R/o Bipolar II. Rule out ADhD  Axis II: Deferred  Axis III:  Past Medical History  Diagnosis Date  . Asthma   . History of chicken pox     childhood  . Depression     treated with anxiety- current  counseling   . History of bronchitis   . History of headache   . GERD (gastroesophageal reflux disease)   . Allergic rhinitis     Allergy- shots with Dr Sharyn Lull  . History of migraines   . Herniated disc     back  . Menorrhagia   . Anxiety     has a history of panic disorder  . Amenorrhea   . PCOS (polycystic ovarian syndrome)     Axis IV: School and job stress   Treatment Plan and Summary: Depression/ mood disorder; Continue Lexapro. Patient is not interested  in a mood stabilizer she does not feel that she is dysfunctional.  Panic disorder:  Lexapro is also helping her panic symptoms and depression. Refilled lexapro  but take half per day.  ADHD ; baseline with no deterioration without medications. Since on metformin for ovarian cystic she feels her adhd has gone better. Pertinent Labs and Relevant Prior Notes reviewed. Medication Side effects, benefits and risks reviewed/discussed with Patient. Time given for patient to respond and asks questions regarding the Diagnosis and Medications. Safety concerns and to report to ER if suicidal or call 911. Relevant Medications refilled or called in to pharmacy. Discussed weight maintenance and Sleep Hygiene. Follow up with Primary care provider in regards to Medical conditions. Recommend compliance with medications and follow up office appointments. Discussed to avail opportunity to consider or/and continue Individual therapy with Counselor. Greater than 50% of time was spend in counseling and coordination of care with the patient.  Schedule for Follow up visit in 12 weeks or call in earlier as  necessary.     Thresa Ross, MD 07/28/2015

## 2015-07-29 ENCOUNTER — Ambulatory Visit (INDEPENDENT_AMBULATORY_CARE_PROVIDER_SITE_OTHER): Payer: 59 | Admitting: Family Medicine

## 2015-07-29 ENCOUNTER — Ambulatory Visit (INDEPENDENT_AMBULATORY_CARE_PROVIDER_SITE_OTHER): Payer: 59

## 2015-07-29 VITALS — BP 126/82 | HR 75 | Temp 98.1°F | Resp 18 | Ht 68.0 in | Wt 334.2 lb

## 2015-07-29 DIAGNOSIS — Z23 Encounter for immunization: Secondary | ICD-10-CM

## 2015-07-29 DIAGNOSIS — M79672 Pain in left foot: Secondary | ICD-10-CM

## 2015-07-29 NOTE — Progress Notes (Signed)
Subjective:  By signing my name below, I, Raven Small, attest that this documentation has been prepared under the direction and in the presence of Meredith Staggers, MD.  Electronically Signed: Andrew Au, ED Scribe. 07/29/2015. 4:18 PM.   Patient ID: Jessica Castro, female    DOB: [birth year], 29 y.o.   MRN: [id number]  HPI   Chief Complaint  Patient presents with  . Foot Pain    x 7 months, left foot  . Immunizations    flu vaccine   HPI Comments: Jessica Castro is a 29 y.o. female who presents to the [facility] complaining of a left foot injury that occurred 7 months ago. Pt dropped an object on left foot 7 months ago. She had negative X-rays at that time and was told left foot was bruised. She sustained a wound to dorsum of foot that has scabbed over and healed. States wound opened once, but re scabbed. She reports wound will occasionally itch causing her to scratch area. She has some tenderness to area with touch. 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 She takes metformin for PCOS.    Pt is also requesting flu shot.    Patient Active Problem List   Diagnosis Date Noted  . PCOS (polycystic ovarian syndrome) 08/06/2014  . Bilateral ovarian cysts per GYN 01/2014 03/06/2014  . Onychocryptosis 07/23/2013  . Anxiety and depression 07/14/2013  . GERD (gastroesophageal reflux disease) 07/14/2013  . Morbid obesity (HCC) 07/14/2013  . Herniated lumbar disc without myelopathy 07/14/2013  . Sinusitis 07/01/2013  . Paronychia of toe of right foot 07/01/2013  . Urinary frequency 05/09/2012  . Hemorrhoids 06/02/2011  . Constipation 03/21/2011  . Lumbar back pain 03/21/2011  . Extrinsic asthma 03/21/2011  . Allergic rhinitis 03/21/2011  . Migraine 03/21/2011   Past Medical History  Diagnosis Date  . Asthma   . History of chicken pox     childhood  . Depression     treated with anxiety- current   counseling   . History of bronchitis   . History of headache   . GERD (gastroesophageal reflux disease)   . Allergic rhinitis     Allergy- shots with Dr Sharyn Lull  . History of migraines   . Herniated disc     back  . Menorrhagia   . Anxiety     has a history of panic disorder  . Amenorrhea   . PCOS (polycystic ovarian syndrome)   . Allergy    Past Surgical History  Procedure Laterality Date  . Wisdom tooth extraction  2003   Allergies  Allergen Reactions  . Peanuts [Peanut Oil]    Prior to Admission medications   Medication Sig Start Date End Date Taking? Authorizing Provider  albuterol (PROVENTIL HFA;VENTOLIN HFA) 108 (90 BASE) MCG/ACT inhaler Inhale 2 inhaltions tid for wheezing.  Use 2 inhlations prior to exercise 11/07/13  Yes Kendrick Ranch, MD  cetirizine (ZYRTEC) 10 MG tablet Take 10 mg by mouth daily.     Yes Historical Provider, MD  escitalopram (LEXAPRO) 10 MG tablet Take 0.5 tablets (5 mg total) by mouth daily. 07/28/15  Yes Thresa Ross, MD  metFORMIN (GLUCOPHAGE-XR) 500 MG 24 hr tablet TK 1 T PO D 03/09/15  Yes Historical Provider, MD  norethindrone-ethinyl estradiol (JUNEL FE,GILDESS FE,LOESTRIN FE) 1-20 MG-MCG tablet Take 1 tablet by mouth daily. 05/13/15  Yes Verner Chol, CNM  fluticasone Aleda Grana)  50 MCG/ACT nasal spray Place 2 sprays into both nostrils daily. Patient not taking: Reported on 03/31/2015 07/01/13   Waldon MerlWilliam C Martin, PA-C  montelukast (SINGULAIR) 10 MG tablet Take 1 tablet (10 mg total) by mouth daily. PATIENT NEEDS OFFICE VISIT FOR ADDITIONAL REFILLS Patient not taking: Reported on 07/29/2015 10/30/14   Thao P Le, DO  nitrofurantoin, macrocrystal-monohydrate, (MACROBID) 100 MG capsule Take 1 capsule (100 mg total) by mouth 2 (two) times daily. Patient not taking: Reported on 07/29/2015 03/31/15   Verner Choleborah S Leonard, CNM  OMEPRAZOLE PO Take 1 tablet by mouth daily. Reported on 07/29/2015    Historical Provider, MD  phenazopyridine (PYRIDIUM) 100  MG tablet Take 1 tablet (100 mg total) by mouth 3 (three) times daily as needed for pain. Patient not taking: Reported on 07/29/2015 03/31/15   Verner Choleborah S Leonard, CNM   Social History   Social History  . Marital Status: Single    Spouse Name: N/A  . Number of Children: N/A  . Years of Education: N/A   Occupational History  . Not on file.   Social History Main Topics  . Smoking status: Never Smoker   . Smokeless tobacco: Never Used  . Alcohol Use: 0.6 - 1.2 oz/week    1-2 Standard drinks or equivalent per week  . Drug Use: Yes    Special: Marijuana     Comment: occ  . Sexual Activity:    Partners: Male    Birth Control/ Protection: Pill   Other Topics Concern  . Not on file   Social History Narrative   Review of Systems  Constitutional: Negative for fever and chills.  Musculoskeletal: Positive for myalgias.  Skin: Positive for color change and wound.  Neurological: Negative for weakness and numbness.   Objective:   Physical Exam  Constitutional: She is oriented to person, place, and time. She appears well-developed and well-nourished. No distress.  HENT:  Head: Normocephalic and atraumatic.  Eyes: Conjunctivae and EOM are normal.  Neck: Neck supple.  Cardiovascular: Normal rate.   Pulmonary/Chest: Effort normal.  Musculoskeletal: Normal range of motion.  On dorsum of left foot there is a 1 x 1.5 cm area of hyperpigmentation with some excoriated area centrally.  Some slight induration on the surface of that skin. Render along disttal 3rd -4th MT. No erythema or warmth. Full ROM of toes, pain free  NVI distally  Neurological: She is alert and oriented to person, place, and time.  Skin: Skin is warm and dry.  Psychiatric: She has a normal mood and affect. Her behavior is normal.  Nursing note and vitals reviewed.   Filed Vitals:   07/29/15 1420  BP: 126/82  Pulse: 75  Temp: 98.1 F (36.7 C)  TempSrc: Oral  Resp: 18  Height: 5\' 8"  (1.727 m)  Weight: 334 lb 3.2  oz (151.592 kg)  SpO2: 98%    [facility] reading (PRIMARY) by Dr. Neva SeatGreene. Left foot:  Questionable scar tissue dorsal foot seen on lateral view. No bony findings.  Assessment & Plan:  Jessica PancoastKatie Castro is a 29 y.o. female Left foot pain - Plan: DG Foot 2 Views Left  -Suspected scar tissue from initial wound, and secondary excoriation. Advised to use over-the-counter hydrocortisone cream up to twice per day to avoid scratching area, shoe with wide toe box, and cover with bandage if needed for shoe to not rub on area. Advised call back if symptoms persist next 2-3 weeks. Can refer to ortho/foot specialist if needed.   Flu vaccine need -  Plan: Flu Vaccine QUAD 36+ mos IM given.   No orders of the defined types were placed in this encounter.   Patient Instructions  Try over-the-counter hydrocortisone cream 1% up to twice per day as needed for itching. You can also place a bandage over the area if your shoe or other footwear is rubbing on it. If pain is not improving in the next few weeks, let me know and I can refer you to a foot specialist.Return to the clinic or go to the nearest emergency room if any of your symptoms worsen or new symptoms occur.   I personally performed the services described in this documentation, which was scribed in my presence. The recorded information has been reviewed and considered, and addended by me as needed.

## 2015-07-29 NOTE — Patient Instructions (Signed)
Try over-the-counter hydrocortisone cream 1% up to twice per day as needed for itching. You can also place a bandage over the area if your shoe or other footwear is rubbing on it. If pain is not improving in the next few weeks, let me know and I can refer you to a foot specialist.Return to the clinic or go to the nearest emergency room if any of your symptoms worsen or new symptoms occur.

## 2015-09-15 ENCOUNTER — Ambulatory Visit (INDEPENDENT_AMBULATORY_CARE_PROVIDER_SITE_OTHER): Payer: Self-pay | Admitting: Psychiatry

## 2015-09-15 ENCOUNTER — Encounter (HOSPITAL_COMMUNITY): Payer: Self-pay | Admitting: Psychiatry

## 2015-09-15 VITALS — BP 130/80 | HR 96 | Ht 68.0 in | Wt 315.0 lb

## 2015-09-15 DIAGNOSIS — F909 Attention-deficit hyperactivity disorder, unspecified type: Secondary | ICD-10-CM

## 2015-09-15 DIAGNOSIS — F331 Major depressive disorder, recurrent, moderate: Secondary | ICD-10-CM

## 2015-09-15 DIAGNOSIS — F410 Panic disorder [episodic paroxysmal anxiety] without agoraphobia: Secondary | ICD-10-CM

## 2015-09-15 DIAGNOSIS — F988 Other specified behavioral and emotional disorders with onset usually occurring in childhood and adolescence: Secondary | ICD-10-CM

## 2015-09-15 MED ORDER — LAMOTRIGINE 25 MG PO TABS: 25.0000 mg | ORAL_TABLET | Freq: Every day | ORAL | Status: DC

## 2015-09-15 MED ORDER — CLONAZEPAM 0.5 MG PO TBDP: 0.5000 mg | ORAL_TABLET | Freq: Every day | ORAL | Status: DC | PRN

## 2015-09-15 MED ORDER — CLONAZEPAM 0.5 MG PO TABS: 0.5000 mg | ORAL_TABLET | Freq: Two times a day (BID) | ORAL | Status: DC | PRN

## 2015-09-15 NOTE — Progress Notes (Signed)
Patient ID: Jessica Castro, female   DOB: Sep 11, 1985, 30 y.o.   MRN: [id number]  [facility] Follow up Outpatient visit   Zikeria Keough [id number] 30 y.o.  09/15/2015 10:45 AM  Chief Complaint:  Depression and anxiety  History of Present Illness:   Patient Presents for follow up and medication management appointment for depression, adhd and anxiety. Initially "She has been referred by her primary care physician to a psychiatrist she has been taking Zoloft in the past for depression but apparently she was concerned about weight gain so she stopped it. Then she started seeing another psychiatrist Dr. Nolen Mu she was started on Wellbutrin but she was concerned about the stomach upset so she stopped that. Her provider did not like the fact that she has been stopping her medications this led to the concern that she wants to change the provider. She's also been on BuSpar for anxiety and panic-like symptoms but apparently it did not help the depression and is causing nausea"  Last visit she was doing reasonable and we continued Lexapro. Overall the last couple weeks she has been feeling down and anxious worried about her relationship with about also her school. She feels she is having apprehension and anxiety.  Sleep is manageable. Her inattention is not significant. She is trying to work on the future and may have to live with her parents while she is trying to finish her school Aggravating factor: relationship. Had job stress. 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  Modifying factors include friends and still she tries to keep herself busy.  Some infrequent panic attacks but anixiety has worsened. Says she has tried klonopine before that helps increase lexpro can make her somewhat elevated.  Severity of depression; 6 out of 10. 10 being no depression  No history of sexual trauma but physical trauma or difficult growing up as according to her that was very harsh and difficult to deal  with. Her mom according to her was an alcoholic and also suffered from anxiety. No psychosis  Past Psychiatric History/Hospitalization(s) Since 2012 she has been on medications and has seen outpatient providers she continues seeing her outpatient provider as a therapist currently at [facility]. No psychiatric admissions or suicidal attempt before  Hospitalization for psychiatric illness: No   Medical History; Past Medical History  Diagnosis Date  . Asthma   . History of chicken pox     childhood  . Depression     treated with anxiety- current  counseling   . History of bronchitis   . History of headache   . GERD (gastroesophageal reflux disease)   . Allergic rhinitis     Allergy- shots with Dr Sharyn Lull  . History of migraines   . Herniated disc     back  . Menorrhagia   . Anxiety     has a history of panic disorder  . Amenorrhea   . PCOS (polycystic ovarian syndrome)   . Allergy     Allergies: Allergies  Allergen Reactions  . Peanuts [Peanut Oil]     Medications: Outpatient Encounter Prescriptions as of 09/15/2015  Medication Sig  . albuterol (PROVENTIL HFA;VENTOLIN HFA) 108 (90 BASE) MCG/ACT inhaler Inhale 2 inhaltions tid for wheezing.  Use 2 inhlations prior to exercise  . cetirizine (ZYRTEC) 10 MG tablet Take 10 mg by mouth daily.    Marland Kitchen escitalopram (LEXAPRO) 10 MG tablet Take 0.5 tablets (5 mg total) by mouth daily.  . metFORMIN (GLUCOPHAGE-XR) 500 MG 24 hr  tablet TK 1 T PO D  . norethindrone-ethinyl estradiol (JUNEL FE,GILDESS FE,LOESTRIN FE) 1-20 MG-MCG tablet Take 1 tablet by mouth daily.  Marland Kitchen OMEPRAZOLE PO Take 1 tablet by mouth daily. Reported on 07/29/2015  . clonazePAM (KLONOPIN) 0.5 MG tablet Take 1 tablet (0.5 mg total) by mouth 2 (two) times daily as needed for anxiety.  . lamoTRIgine (LAMICTAL) 25 MG tablet Take 1 tablet (25 mg total) by mouth daily. Take one tablet daily for a week and then start taking 2 tablets.  .  [DISCONTINUED] clonazePAM (KLONOPIN) 0.5 MG disintegrating tablet Take 1 tablet (0.5 mg total) by mouth daily as needed for seizure.   No facility-administered encounter medications on file as of 09/15/2015.   Family History; Family History  Problem Relation Age of Onset  . Alcohol abuse Mother   . Hyperlipidemia Mother   . Heart disease Mother   . Anxiety disorder Mother   . Hyperlipidemia Father   . Diabetes Father   . Hyperlipidemia Maternal Grandmother   . Alzheimer's disease Maternal Grandmother   . Pancreatic cancer Paternal Grandfather   . Stroke Maternal Grandfather   . Alcohol abuse Maternal Grandfather   . Anxiety disorder Sister        Labs:       Musculoskeletal: Strength & Muscle Tone: within normal limits Gait & Station: normal Patient leans: N/A  Mental Status Examination;   Psychiatric Specialty Exam: Physical Exam  Constitutional: No distress.  HENT:  Head: Normocephalic and atraumatic.  Skin: She is not diaphoretic.    Review of Systems  Constitutional: Negative for fever.  Respiratory: Negative for cough.   Cardiovascular: Negative for palpitations.  Skin: Negative for rash.  Psychiatric/Behavioral: Negative for depression and suicidal ideas. The patient is nervous/anxious.     Blood pressure 130/80, pulse 96, height  (1.727 m), weight 315 lb (142.883 kg), last menstrual period 09/06/2015, SpO2 94 %.Body mass index is 47.91 kg/(m^2).  General Appearance: Casual, Neat and Well Groomed  Eye Contact::  Good  Speech:  Normal Rate  Volume:  Normal  Mood:  anxious  Affect:  Congruent and Constricted  Thought Process:  Coherent and Intact  Orientation:  Full (Time, Place, and Person)  Thought Content:  Rumination  Suicidal Thoughts:  No  Homicidal Thoughts:  No  Memory:  Immediate;   Fair Recent;   Fair  Judgement:  Fair  Insight:  Fair  Psychomotor Activity:  Decreased  Concentration:  Fair  Recall:  Fair  Akathisia:  Negative   Handed:  Right  AIMS (if indicated):     Assets:  Communication Skills Desire for Improvement Social Support Vocational/Educational  Sleep:        Assessment: Axis I: Major depressive disorder recurrent moderate. Panic disorder. R/o Bipolar II. Rule out ADhD  Axis II: Deferred  Axis III:  Past Medical History  Diagnosis Date  . Asthma   . History of chicken pox     childhood  . Depression     treated with anxiety- current  counseling   . History of bronchitis   . History of headache   . GERD (gastroesophageal reflux disease)   . Allergic rhinitis     Allergy- shots with Dr Sharyn Lull  . History of migraines   . Herniated disc     back  . Menorrhagia   . Anxiety     has a history of panic disorder  . Amenorrhea   . PCOS (polycystic ovarian syndrome)   . Allergy  Axis IV: School and job stress   Treatment Plan and Summary: Depression/ mood disorder; add lamictal 25 increase to 50 in one week. continuee Lexapro small dose. Side effects and rash explained.  Panic disorder:  Lexapro as above. Add klonopine0,5 mg prn. Prescription written.  ADHD ; baseline  Since on metformin for ovarian cystic she feels not too concern for adhd. Pertinent Labs and Relevant Prior Notes reviewed. Medication Side effects, benefits and risks reviewed/discussed with Patient. Time given for patient to respond and asks questions regarding the Diagnosis and Medications. Safety concerns and to report to ER if suicidal or call 911. Relevant Medications refilled or called in to pharmacy. Discussed weight maintenance and Sleep Hygiene. Follow up with Primary care provider in regards to Medical conditions. Recommend compliance with medications and follow up office appointments. Discussed to avail opportunity to consider or/and continue Individual therapy with Counselor. Greater than 50% of time was spend in counseling and coordination of care with the patient.  Schedule for Follow up visit in 4  weeks or call in earlier as necessary. Says not have insurance so may not come that early. Time spent: 25 minutes     Thresa Ross, MD 09/15/2015

## 2015-10-22 ENCOUNTER — Ambulatory Visit (INDEPENDENT_AMBULATORY_CARE_PROVIDER_SITE_OTHER): Payer: Self-pay | Admitting: Psychiatry

## 2015-10-22 ENCOUNTER — Encounter (HOSPITAL_COMMUNITY): Payer: Self-pay | Admitting: Psychiatry

## 2015-10-22 VITALS — BP 128/74 | HR 94 | Ht 68.0 in | Wt 310.0 lb

## 2015-10-22 DIAGNOSIS — F909 Attention-deficit hyperactivity disorder, unspecified type: Secondary | ICD-10-CM

## 2015-10-22 DIAGNOSIS — F410 Panic disorder [episodic paroxysmal anxiety] without agoraphobia: Secondary | ICD-10-CM

## 2015-10-22 DIAGNOSIS — F988 Other specified behavioral and emotional disorders with onset usually occurring in childhood and adolescence: Secondary | ICD-10-CM

## 2015-10-22 DIAGNOSIS — F331 Major depressive disorder, recurrent, moderate: Secondary | ICD-10-CM

## 2015-10-22 MED ORDER — BUPROPION HCL ER (SR) 100 MG PO TB12: 200.0000 mg | ORAL_TABLET | Freq: Two times a day (BID) | ORAL | Status: DC

## 2015-10-22 MED ORDER — CLONAZEPAM 0.5 MG PO TABS
0.5000 mg | ORAL_TABLET | Freq: Two times a day (BID) | ORAL | Status: DC | PRN
Start: 2015-10-22 — End: 2016-02-05

## 2015-10-22 MED ORDER — TOPIRAMATE 25 MG PO CPSP: 25.0000 mg | ORAL_CAPSULE | Freq: Two times a day (BID) | ORAL | Status: DC

## 2015-10-22 NOTE — Progress Notes (Signed)
Patient ID: Jessica Castro, female   DOB: September 15, 1985, 30 y.o.   MRN: [id number] [facility] Follow up Outpatient visit   Karianna Gusman [id number] 30 y.o.  10/22/2015 4:32 PM  Chief Complaint:  Depression and anxiety  History of Present Illness:   Patient Presents for follow up and medication management appointment for depression, adhd and anxiety. Initially "She has been referred by her primary care physician to a psychiatrist she has been taking Zoloft in the past for depression but apparently she was concerned about weight gain so she stopped it. Then she started seeing another psychiatrist Dr. Nolen Mu she was started on Wellbutrin but she was concerned about the stomach upset so she stopped that. Her provider did not like the fact that she has been stopping her medications this led to the concern that she wants to change the provider. She's also been on BuSpar for anxiety and panic-like symptoms but apparently it did not help the depression and is causing nausea"  Last visit she was doing better on Lexapro and we increased her Lamictal to 50 program.  klonopine was added last visit as well that helps some anxiety. Apparently she has been feeling down depressed or psychotic down the Lamictal by 25 mg and now wants to stop it. Some relationship issues and also feels low-grade self-esteem and feels depressed. She overanalyzes to medications and want to change it says that she has been on different medications and she is not sure if she is bipolar or was it medication induced. Says that she is not sure if Lexapro is helping although it does help her anxiety but she is feeling more down. She is considerably increase the dose it may make her hyper  Aggravating factor: relationship. Had job stress. 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  Modifying factors include friends and still she tries to keep herself busy.  Some infrequent panic attacks but depression has worsened.  Severity of  depression; 5 out of 10. 10 being no depression (worsened)  No history of sexual trauma but physical trauma or difficult growing up as according to her that was very harsh and difficult to deal with. Her mom according to her was an alcoholic and also suffered from anxiety. No psychosis   No psychiatric admissions or suicidal attempt before  Hospitalization for psychiatric illness: No   Medical History; Past Medical History  Diagnosis Date  . Asthma   . History of chicken pox     childhood  . Depression     treated with anxiety- current  counseling   . History of bronchitis   . History of headache   . GERD (gastroesophageal reflux disease)   . Allergic rhinitis     Allergy- shots with Dr Sharyn Lull  . History of migraines   . Herniated disc     back  . Menorrhagia   . Anxiety     has a history of panic disorder  . Amenorrhea   . PCOS (polycystic ovarian syndrome)   . Allergy     Allergies: Allergies  Allergen Reactions  . Peanuts [Peanut Oil]     Medications: Outpatient Encounter Prescriptions as of 10/22/2015  Medication Sig  . albuterol (PROVENTIL HFA;VENTOLIN HFA) 108 (90 BASE) MCG/ACT inhaler Inhale 2 inhaltions tid for wheezing.  Use 2 inhlations prior to exercise  . cetirizine (ZYRTEC) 10 MG tablet Take 10 mg by mouth daily.    . clonazePAM (KLONOPIN) 0.5 MG tablet Take 1 tablet (0.5 mg total) by mouth 2 (  two) times daily as needed for anxiety.  . metFORMIN (GLUCOPHAGE-XR) 500 MG 24 hr tablet TK 1 T PO D  . norethindrone-ethinyl estradiol (JUNEL FE,GILDESS FE,LOESTRIN FE) 1-20 MG-MCG tablet Take 1 tablet by mouth daily.  Marland Kitchen OMEPRAZOLE PO Take 1 tablet by mouth daily. Reported on 07/29/2015  . [DISCONTINUED] clonazePAM (KLONOPIN) 0.5 MG tablet Take 1 tablet (0.5 mg total) by mouth 2 (two) times daily as needed for anxiety.  . [DISCONTINUED] escitalopram (LEXAPRO) 10 MG tablet Take 0.5 tablets (5 mg total) by mouth daily.  . [DISCONTINUED] lamoTRIgine (LAMICTAL) 25 MG  tablet Take 1 tablet (25 mg total) by mouth daily. Take one tablet daily for a week and then start taking 2 tablets.  Marland Kitchen buPROPion (WELLBUTRIN SR) 100 MG 12 hr tablet Take 2 tablets (200 mg total) by mouth 2 (two) times daily. Start one a day for first week then 2 tablets a day  . topiramate (TOPAMAX) 25 MG capsule Take 1 capsule (25 mg total) by mouth 2 (two) times daily.   No facility-administered encounter medications on file as of 10/22/2015.   Family History; Family History  Problem Relation Age of Onset  . Alcohol abuse Mother   . Hyperlipidemia Mother   . Heart disease Mother   . Anxiety disorder Mother   . Hyperlipidemia Father   . Diabetes Father   . Hyperlipidemia Maternal Grandmother   . Alzheimer's disease Maternal Grandmother   . Pancreatic cancer Paternal Grandfather   . Stroke Maternal Grandfather   . Alcohol abuse Maternal Grandfather   . Anxiety disorder Sister     Musculoskeletal: Strength & Muscle Tone: within normal limits Gait & Station: normal Patient leans: N/A  Mental Status Examination;   Psychiatric Specialty Exam: Physical Exam  Constitutional: No distress.  HENT:  Head: Normocephalic and atraumatic.  Skin: She is not diaphoretic.    Review of Systems  Constitutional: Negative for fever.  Respiratory: Negative for cough.   Cardiovascular: Negative for palpitations.  Skin: Negative for rash.  Psychiatric/Behavioral: Positive for depression. Negative for suicidal ideas. The patient is nervous/anxious.     Blood pressure 128/74, pulse 94, height  (1.727 m), weight 310 lb (140.615 kg), SpO2 96 %.Body mass index is 47.15 kg/(m^2).  General Appearance: Casual, Neat and Well Groomed  Eye Contact::  Good  Speech:  Normal Rate  Volume:  Normal  Mood:  anxious  Affect:  Congruent and Constricted  Thought Process:  Coherent and Intact  Orientation:  Full (Time, Place, and Person)  Thought Content:  Rumination  Suicidal Thoughts:  No  Homicidal  Thoughts:  No  Memory:  Immediate;   Fair Recent;   Fair  Judgement:  Fair  Insight:  Fair  Psychomotor Activity:  Decreased  Concentration:  Fair  Recall:  Fair  Akathisia:  Negative  Handed:  Right  AIMS (if indicated):     Assets:  Communication Skills Desire for Improvement Social Support Vocational/Educational  Sleep:        Assessment: Axis I: Major depressive disorder recurrent moderate. Panic disorder. R/o Bipolar II. Rule out ADhD  Axis II: Deferred  Axis III:  Past Medical History  Diagnosis Date  . Asthma   . History of chicken pox     childhood  . Depression     treated with anxiety- current  counseling   . History of bronchitis   . History of headache   . GERD (gastroesophageal reflux disease)   . Allergic rhinitis  Allergy- shots with Dr Sharyn LullKoslow  . History of migraines   . Herniated disc     back  . Menorrhagia   . Anxiety     has a history of panic disorder  . Amenorrhea   . PCOS (polycystic ovarian syndrome)   . Allergy     Axis IV: School and job stress   Treatment Plan and Summary: Depression/ mood disorder; worsened. Dc lamictal. Start topomax 25 increase to 50mg  as she is concern of weight gain as well. Side effects reviewed.  DC lexapro. Start wellbutrin 100mg  increase to 200mg  in one week. Panic disorder: continue klonopine0,5 mg prn. Prescription written. May need lexapro back after review.   ADHD ; baseline Pertinent Labs and Relevant Prior Notes reviewed. Medication Side effects, benefits and risks reviewed/discussed with Patient. Time given for patient to respond and asks questions regarding the Diagnosis and Medications. Safety concerns and to report to ER if suicidal or call 911. Relevant Medications refilled or called in to pharmacy. Discussed weight maintenance and Sleep Hygiene.  Discussed to avail opportunity to consider or/and continue Individual therapy with Counselor. Greater than 50% of time was spend in counseling  and coordination of care with the patient.  Schedule for Follow up visit in 2 to 4  weeks or call in earlier as necessary. Says not have insurance so may not come that early. Time spent: 25 minutes     Thresa RossAKHTAR, Beryl Hornberger, MD 10/22/2015

## 2015-11-12 ENCOUNTER — Telehealth (HOSPITAL_COMMUNITY): Payer: Self-pay | Admitting: *Deleted

## 2015-11-12 NOTE — Telephone Encounter (Signed)
FYI: Pt called to inform provider she would like to continue on previous medication, Lexapro 5mg . Pt states she did not begin Wellbutrin. Pt states she is doing fine and will schedule a f/u appt once her new insurance plan begins.

## 2015-11-17 ENCOUNTER — Ambulatory Visit (HOSPITAL_COMMUNITY): Payer: Self-pay | Admitting: Psychiatry

## 2015-12-08 ENCOUNTER — Ambulatory Visit (HOSPITAL_COMMUNITY): Payer: Self-pay | Admitting: Psychiatry

## 2015-12-21 ENCOUNTER — Ambulatory Visit (HOSPITAL_COMMUNITY): Payer: Self-pay | Admitting: Psychiatry

## 2016-02-05 ENCOUNTER — Encounter: Payer: Self-pay | Admitting: Certified Nurse Midwife

## 2016-02-05 ENCOUNTER — Ambulatory Visit (INDEPENDENT_AMBULATORY_CARE_PROVIDER_SITE_OTHER): Payer: BLUE CROSS/BLUE SHIELD | Admitting: Certified Nurse Midwife

## 2016-02-05 VITALS — BP 108/72 | HR 72 | Resp 16 | Ht 66.75 in | Wt 308.0 lb

## 2016-02-05 DIAGNOSIS — Z3041 Encounter for surveillance of contraceptive pills: Secondary | ICD-10-CM | POA: Diagnosis not present

## 2016-02-05 DIAGNOSIS — Z0000 Encounter for general adult medical examination without abnormal findings: Secondary | ICD-10-CM

## 2016-02-05 DIAGNOSIS — Z01419 Encounter for gynecological examination (general) (routine) without abnormal findings: Secondary | ICD-10-CM | POA: Diagnosis not present

## 2016-02-05 LAB — POCT URINALYSIS DIPSTICK
Bilirubin, UA: NEGATIVE
GLUCOSE UA: NEGATIVE
Ketones, UA: NEGATIVE
Leukocytes, UA: NEGATIVE
Nitrite, UA: NEGATIVE
Protein, UA: NEGATIVE
RBC UA: NEGATIVE
UROBILINOGEN UA: NEGATIVE
pH, UA: 5

## 2016-02-05 MED ORDER — NORETHIN ACE-ETH ESTRAD-FE 1-20 MG-MCG PO TABS: 1.0000 | ORAL_TABLET | Freq: Every day | ORAL | Status: DC

## 2016-02-05 NOTE — Progress Notes (Signed)
30 y.o. G0P0000 Single  Caucasian Fe here for annual exam. Periods normal, no issues. Contraception OCP working well. 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 Emotionally doing OK. Seeing Dr. Modesto CharonWong for PCP once yearly and management of Metformin.. Sees Dr. Betsey HolidayActar for medication management of Lexapro. Sexually active new partner, desires STD screening.  Working on weight loss with 25 pound weight loss! No other health concerns today.     Patient's last menstrual period was 01/24/2016 (exact date).          Sexually active: Yes.    The current method of family planning is OCP (estrogen/progesterone).    Exercising: Yes.    strength & cardio Smoker:  no  Health Maintenance: Pap: 02-18-14 neg MMG:  none Colonoscopy:  none BMD:   none TDaP:  2015 Shingles: no Pneumonia: no Hep C and HIV: both neg 2016 Labs: poct urine-neg Self breast exam: done occ   reports that she has never smoked. She has never used smokeless tobacco. She reports that she drinks about 0.6 - 1.2 oz of alcohol per week. She reports that she uses illicit drugs (Marijuana).  Past Medical History  Diagnosis Date  . Asthma   . History of chicken pox     childhood  . Depression     treated with anxiety- current  counseling   . History of bronchitis   . History of headache   . GERD (gastroesophageal reflux disease)   . Allergic rhinitis     Allergy- shots with Dr Sharyn LullKoslow  . History of migraines   . Herniated disc     back  . Menorrhagia   . Anxiety     has a history of panic disorder  . Amenorrhea   . PCOS (polycystic ovarian syndrome)   . Allergy     Past Surgical History  Procedure Laterality Date  . Wisdom tooth extraction  2003    Current Outpatient Prescriptions  Medication Sig Dispense Refill  . albuterol (PROVENTIL HFA;VENTOLIN HFA) 108 (90 BASE) MCG/ACT inhaler Inhale 2 inhaltions tid for wheezing.  Use 2 inhlations prior to exercise 1 Inhaler 0   . escitalopram (LEXAPRO) 10 MG tablet TAKE 0.5 TABLET (5 MG TOTAL) BY MOUTH DAILY  2  . levocetirizine (XYZAL) 5 MG tablet Take 5 mg by mouth every evening.    . metFORMIN (GLUCOPHAGE-XR) 500 MG 24 hr tablet TK 1 T PO D  3  . norethindrone-ethinyl estradiol (JUNEL FE,GILDESS FE,LOESTRIN FE) 1-20 MG-MCG tablet Take 1 tablet by mouth daily. 1 Package 11  . OMEPRAZOLE PO Take 1 tablet by mouth daily. Reported on 07/29/2015    . [DISCONTINUED] lamoTRIgine (LAMICTAL) 25 MG tablet Take 1 tablet (25 mg total) by mouth daily. Take one tablet daily for a week and then start taking 2 tablets. 60 tablet 1   No current facility-administered medications for this visit.    Family History  Problem Relation Age of Onset  . Alcohol abuse Mother   . Hyperlipidemia Mother   . Heart disease Mother   . Anxiety disorder Mother   . Hyperlipidemia Father   . Diabetes Father   . Hyperlipidemia Maternal Grandmother   . Alzheimer's disease Maternal Grandmother   . Pancreatic cancer Paternal Grandfather   . Stroke Maternal Grandfather   . Alcohol abuse Maternal Grandfather   . Anxiety disorder Sister     ROS:  Pertinent items are noted in HPI.  Otherwise, a comprehensive  ROS was negative.  Exam:   BP 108/72 mmHg  Pulse 72  Resp 16  Ht 5' 6.75" (1.695 m)  Wt 308 lb (139.708 kg)  BMI 48.63 kg/m2  LMP 01/24/2016 (Exact Date) Height: 5' 6.75" (169.5 cm) Ht Readings from Last 3 Encounters:  02/05/16 5' 6.75" (1.695 m)  10/22/15 5\' 8"  (1.727 m)  09/15/15 5\' 8"  (1.727 m)    General appearance: alert, cooperative and appears stated age Head: Normocephalic, without obvious abnormality, atraumatic Neck: no adenopathy, supple, symmetrical, trachea midline and thyroid normal to inspection and palpation Lungs: clear to auscultation bilaterally Breasts: normal appearance, no masses or tenderness, No nipple retraction or dimpling, No nipple discharge or bleeding, No axillary or supraclavicular  adenopathy Heart: regular rate and rhythm Abdomen: soft, non-tender; no masses,  no organomegaly Extremities: extremities normal, atraumatic, no cyanosis or edema Skin: Skin color, texture, turgor normal. No rashes or lesions Lymph nodes: Cervical, supraclavicular, and axillary nodes normal. No abnormal inguinal nodes palpated Neurologic: Grossly normal   Pelvic: External genitalia:  no lesions              Urethra:  normal appearing urethra with no masses, tenderness or lesions              Bartholin's and Skene's: normal                 Vagina: normal appearing vagina with normal color and discharge, no lesions              Cervix: no cervical motion tenderness, no lesions and non tender.              Pap taken: No. Bimanual Exam:  Uterus:  normal size, contour, position, consistency, mobility, non-tender and mid position              Adnexa: normal adnexa and no mass, fullness, tenderness               Rectovaginal: Confirms               Anus:  normal sphincter tone, no lesions  Chaperone present: yes  A:  Well Woman with normal exam  Contraception OCP  Morbid obesity down 25 pounds  STD screening desired  Metformin/Lexapro management with MD  P:   Reviewed health and wellness pertinent to exam  Rx Junel Fe 1/20 see order with instructions  Continue to work on weight loss  Labs:HIV,RPR, Hep.C GC,Chlamydia, Affirm  Pap smear as above not taken   counseled on breast self exam, STD prevention, HIV risk factors and prevention, use and side effects of OCP's, adequate intake of calcium and vitamin D, diet and exercise  return annually or prn  An After Visit Summary was printed and given to the patient.

## 2016-02-05 NOTE — Patient Instructions (Signed)
General topics  Next pap or exam is  due in 1 year Take a Women's multivitamin Take 1200 mg. of calcium daily - prefer dietary If any concerns in interim to call back  Breast Self-Awareness Practicing breast self-awareness may pick up problems early, prevent significant medical complications, and possibly save your life. By practicing breast self-awareness, you can become familiar with how your breasts look and feel and if your breasts are changing. This allows you to notice changes early. It can also offer you some reassurance that your breast health is good. One way to learn what is normal for your breasts and whether your breasts are changing is to do a breast self-exam. If you find a lump or something that was not present in the past, it is best to contact your caregiver right away. Other findings that should be evaluated by your caregiver include nipple discharge, especially if it is bloody; skin changes or reddening; areas where the skin seems to be pulled in (retracted); or new lumps and bumps. Breast pain is seldom associated with cancer (malignancy), but should also be evaluated by a caregiver. BREAST SELF-EXAM The best time to examine your breasts is 5 7 days after your menstrual period is over.  ExitCare Patient Information 2013 [employer].   Exercise to Stay Healthy Exercise helps you become and stay healthy. EXERCISE IDEAS AND TIPS Choose exercises that:  You enjoy.  Fit into your day. You do not need to exercise really hard to be healthy. You can do exercises at a slow or medium level and stay healthy. You can:  Stretch before and after working out.  Try yoga, Pilates, or tai chi.  Lift weights.  Walk fast, swim, jog, run, climb stairs, bicycle, dance, or rollerskate.  Take aerobic classes. Exercises that burn about 150 calories:  Running 1  miles in 15 minutes.  Playing volleyball for 45 to 60 minutes.  Washing and waxing a car for 45 to 60  minutes.  Playing touch football for 45 minutes.  Walking 1  miles in 35 minutes.  Pushing a stroller 1  miles in 30 minutes.  Playing basketball for 30 minutes.  Raking leaves for 30 minutes.  Bicycling 5 miles in 30 minutes.  Walking 2 miles in 30 minutes.  Dancing for 30 minutes.  Shoveling snow for 15 minutes.  Swimming laps for 20 minutes.  Walking up stairs for 15 minutes.  Bicycling 4 miles in 15 minutes.  Gardening for 30 to 45 minutes.  Jumping rope for 15 minutes.  Washing windows or floors for 45 to 60 minutes. Document Released: 08/20/2010 Document Revised: 10/10/2011 Document Reviewed: 08/20/2010 ExitCare Patient Information 2013 [employer].   Other topics ( that may be useful information):    Sexually Transmitted Disease Sexually transmitted disease (STD) refers to any infection that is passed from person to person during sexual activity. This may happen by way of saliva, semen, blood, vaginal mucus, or urine. Common STDs include:  Gonorrhea.  Chlamydia.  Syphilis.  HIV/AIDS.  Genital herpes.  Hepatitis B and C.  Trichomonas.  Human papillomavirus (HPV).  Pubic lice. CAUSES  An STD may be spread by bacteria, virus, or parasite. A person can get an STD by:  Sexual intercourse with an infected person.  Sharing sex toys with an infected person.  Sharing needles with an infected person.  Having intimate contact with the genitals, mouth, or rectal areas of an infected person. SYMPTOMS  Some people may not have any symptoms, but   they can still pass the infection to others. Different STDs have different symptoms. Symptoms include:  Painful or bloody urination.  Pain in the pelvis, abdomen, vagina, anus, throat, or eyes.  Skin rash, itching, irritation, growths, or sores (lesions). These usually occur in the genital or anal area.  Abnormal vaginal discharge.  Penile discharge in men.  Soft, flesh-colored skin growths in the  genital or anal area.  Fever.  Pain or bleeding during sexual intercourse.  Swollen glands in the groin area.  Yellow skin and eyes (jaundice). This is seen with hepatitis. DIAGNOSIS  To make a diagnosis, your caregiver may:  Take a medical history.  Perform a physical exam.  Take a specimen (culture) to be examined.  Examine a sample of discharge under a microscope.  Perform blood test TREATMENT   Chlamydia, gonorrhea, trichomonas, and syphilis can be cured with antibiotic medicine.  Genital herpes, hepatitis, and HIV can be treated, but not cured, with prescribed medicines. The medicines will lessen the symptoms.  Genital warts from HPV can be treated with medicine or by freezing, burning (electrocautery), or surgery. Warts may come back.  HPV is a virus and cannot be cured with medicine or surgery.However, abnormal areas may be followed very closely by your caregiver and may be removed from the cervix, vagina, or vulva through office procedures or surgery. If your diagnosis is confirmed, your recent sexual partners need treatment. This is true even if they are symptom-free or have a negative culture or evaluation. They should not have sex until their caregiver says it is okay. HOME CARE INSTRUCTIONS  All sexual partners should be informed, tested, and treated for all STDs.  Take your antibiotics as directed. Finish them even if you start to feel better.  Only take over-the-counter or prescription medicines for pain, discomfort, or fever as directed by your caregiver.  Rest.  Eat a balanced diet and drink enough fluids to keep your urine clear or pale yellow.  Do not have sex until treatment is completed and you have followed up with your caregiver. STDs should be checked after treatment.  Keep all follow-up appointments, Pap tests, and blood tests as directed by your caregiver.  Only use latex condoms and water-soluble lubricants during sexual activity. Do not use  petroleum jelly or oils.  Avoid alcohol and illegal drugs.  Get vaccinated for HPV and hepatitis. If you have not received these vaccines in the past, talk to your caregiver about whether one or both might be right for you.  Avoid risky sex practices that can break the skin. The only way to avoid getting an STD is to avoid all sexual activity.Latex condoms and dental dams (for oral sex) will help lessen the risk of getting an STD, but will not completely eliminate the risk. SEEK MEDICAL CARE IF:   You have a fever.  You have any new or worsening symptoms. Document Released: 10/08/2002 Document Revised: 10/10/2011 Document Reviewed: 10/15/2010 [facility] Patient Information 2013 [employer].    Domestic Abuse You are being battered or abused if someone close to you hits, pushes, or physically hurts you in any way. You also are being abused if you are forced into activities. You are being sexually abused if you are forced to have sexual contact of any kind. You are being emotionally abused if you are made to feel worthless or if you are constantly threatened. It is important to remember that help is available. No one has the right to abuse you. PREVENTION OF FURTHER  ABUSE  Learn the warning signs of danger. This varies with situations but may include: the use of alcohol, threats, isolation from friends and family, or forced sexual contact. Leave if you feel that violence is going to occur.  If you are attacked or beaten, report it to the police so the abuse is documented. You do not have to press charges. The police can protect you while you or the attackers are leaving. Get the officer's name and badge number and a copy of the report.  Find someone you can trust and tell them what is happening to you: your caregiver, a nurse, clergy member, close friend or family member. Feeling ashamed is natural, but remember that you have done nothing wrong. No one deserves abuse. Document Released:  07/15/2000 Document Revised: 10/10/2011 Document Reviewed: 09/23/2010 ExitCare Patient Information 2013 [employer].    How Much is Too Much Alcohol? Drinking too much alcohol can cause injury, accidents, and health problems. These types of problems can include:   Car crashes.  Falls.  Family fighting (domestic violence).  Drowning.  Fights.  Injuries.  Burns.  Damage to certain organs.  Having a baby with birth defects. ONE DRINK CAN BE TOO MUCH WHEN YOU ARE:  Working.  Pregnant or breastfeeding.  Taking medicines. Ask your doctor.  Driving or planning to drive. If you or someone you know has a drinking problem, get help from a doctor.  Document Released: 05/14/2009 Document Revised: 10/10/2011 Document Reviewed: 05/14/2009 ExitCare Patient Information 2013 [employer].   Smoking Hazards Smoking cigarettes is extremely bad for your health. Tobacco smoke has over 200 known poisons in it. There are over 60 chemicals in tobacco smoke that cause cancer. Some of the chemicals found in cigarette smoke include:   Cyanide.  Benzene.  Formaldehyde.  Methanol (wood alcohol).  Acetylene (fuel used in welding torches).  Ammonia. Cigarette smoke also contains the poisonous gases nitrogen oxide and carbon monoxide.  Cigarette smokers have an increased risk of many serious medical problems and Smoking causes approximately:  90% of all lung cancer deaths in men.  80% of all lung cancer deaths in women.  90% of deaths from chronic obstructive lung disease. Compared with nonsmokers, smoking increases the risk of:  Coronary heart disease by 2 to 4 times.  Stroke by 2 to 4 times.  Men developing lung cancer by 23 times.  Women developing lung cancer by 13 times.  Dying from chronic obstructive lung diseases by 12 times.  . Smoking is the most preventable cause of death and disease in our society.  WHY IS SMOKING ADDICTIVE?  Nicotine is the chemical  agent in tobacco that is capable of causing addiction or dependence.  When you smoke and inhale, nicotine is absorbed rapidly into the bloodstream through your lungs. Nicotine absorbed through the lungs is capable of creating a powerful addiction. Both inhaled and non-inhaled nicotine may be addictive.  Addiction studies of cigarettes and spit tobacco show that addiction to nicotine occurs mainly during the teen years, when young people begin using tobacco products. WHAT ARE THE BENEFITS OF QUITTING?  There are many health benefits to quitting smoking.   Likelihood of developing cancer and heart disease decreases. Health improvements are seen almost immediately.  Blood pressure, pulse rate, and breathing patterns start returning to normal soon after quitting. QUITTING SMOKING   American Lung Association - 1-800-LUNGUSA  American Cancer Society - 1-800-ACS-2345 Document Released: 08/25/2004 Document Revised: 10/10/2011 Document Reviewed: 04/29/2009 ExitCare Patient Information 2013 ExitCare[employer]   LL[employer].   Stress Management Stress is a state of physical or mental tension that often results from changes in your life or normal routine. Some common causes of stress are:  Death of a loved one.  Injuries or severe illnesses.  Getting fired or changing jobs.  Moving into a new home. Other causes may be:  Sexual problems.  Business or financial losses.  Taking on a large debt.  Regular conflict with someone at home or at work.  Constant tiredness from lack of sleep. It is not just bad things that are stressful. It may be stressful to:  Win the lottery.  Get married.  Buy a new car. The amount of stress that can be easily tolerated varies from person to person. Changes generally cause stress, regardless of the types of change. Too much stress can affect your health. It may lead to physical or emotional problems. Too little stress (boredom) may also become stressful. SUGGESTIONS TO  REDUCE STRESS:  Talk things over with your family and friends. It often is helpful to share your concerns and worries. If you feel your problem is serious, you may want to get help from a professional counselor.  Consider your problems one at a time instead of lumping them all together. Trying to take care of everything at once may seem impossible. List all the things you need to do and then start with the most important one. Set a goal to accomplish 2 or 3 things each day. If you expect to do too many in a single day you will naturally fail, causing you to feel even more stressed.  Do not use alcohol or drugs to relieve stress. Although you may feel better for a short time, they do not remove the problems that caused the stress. They can also be habit forming.  Exercise regularly - at least 3 times per week. Physical exercise can help to relieve that "uptight" feeling and will relax you.  The shortest distance between despair and hope is often a good night's sleep.  Go to bed and get up on time allowing yourself time for appointments without being rushed.  Take a short "time-out" period from any stressful situation that occurs during the day. Close your eyes and take some deep breaths. Starting with the muscles in your face, tense them, hold it for a few seconds, then relax. Repeat this with the muscles in your neck, shoulders, hand, stomach, back and legs.  Take good care of yourself. Eat a balanced diet and get plenty of rest.  Schedule time for having fun. Take a break from your daily routine to relax. HOME CARE INSTRUCTIONS   Call if you feel overwhelmed by your problems and feel you can no longer manage them on your own.  Return immediately if you feel like hurting yourself or someone else. Document Released: 01/11/2001 Document Revised: 10/10/2011 Document Reviewed: 09/03/2007 ExitCare Patient Information 2013 [employer].   

## 2016-02-06 LAB — WET PREP BY MOLECULAR PROBE
CANDIDA SPECIES: NEGATIVE
GARDNERELLA VAGINALIS: NEGATIVE
TRICHOMONAS VAG: NEGATIVE

## 2016-02-06 LAB — HEPATITIS C ANTIBODY: HCV Ab: NEGATIVE

## 2016-02-06 LAB — HIV ANTIBODY (ROUTINE TESTING W REFLEX): HIV 1&2 Ab, 4th Generation: NONREACTIVE

## 2016-02-06 LAB — RPR

## 2016-02-07 NOTE — Progress Notes (Signed)
Encounter reviewed Jessica Hubbert, MD   

## 2016-02-10 LAB — IPS N GONORRHOEA AND CHLAMYDIA BY PCR

## 2016-03-24 ENCOUNTER — Other Ambulatory Visit (HOSPITAL_COMMUNITY): Payer: Self-pay | Admitting: Psychiatry

## 2016-03-24 NOTE — Telephone Encounter (Signed)
Received fax from [facility] requesting a refill for Lexapro 10mg . Per Dr. Gilmore LarocheAkhtar, refill request is denied. Pt was last seen 10/22/15. Pt will need to schedule an appt. LVM for pt to contact office.

## 2016-03-29 ENCOUNTER — Encounter (HOSPITAL_COMMUNITY): Payer: Self-pay | Admitting: Psychiatry

## 2016-03-29 ENCOUNTER — Ambulatory Visit (INDEPENDENT_AMBULATORY_CARE_PROVIDER_SITE_OTHER): Payer: BLUE CROSS/BLUE SHIELD | Admitting: Psychiatry

## 2016-03-29 VITALS — BP 128/72 | HR 82 | Ht 68.0 in | Wt 307.0 lb

## 2016-03-29 DIAGNOSIS — F909 Attention-deficit hyperactivity disorder, unspecified type: Secondary | ICD-10-CM | POA: Diagnosis not present

## 2016-03-29 DIAGNOSIS — F988 Other specified behavioral and emotional disorders with onset usually occurring in childhood and adolescence: Secondary | ICD-10-CM

## 2016-03-29 DIAGNOSIS — F331 Major depressive disorder, recurrent, moderate: Secondary | ICD-10-CM | POA: Diagnosis not present

## 2016-03-29 DIAGNOSIS — F410 Panic disorder [episodic paroxysmal anxiety] without agoraphobia: Secondary | ICD-10-CM

## 2016-03-29 MED ORDER — ESCITALOPRAM OXALATE 10 MG PO TABS: ORAL_TABLET | ORAL | 1 refills | Status: DC
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
## 2016-03-31 ENCOUNTER — Ambulatory Visit: Payer: 59 | Admitting: Certified Nurse Midwife

## 2016-04-13 ENCOUNTER — Ambulatory Visit (INDEPENDENT_AMBULATORY_CARE_PROVIDER_SITE_OTHER): Payer: BLUE CROSS/BLUE SHIELD | Admitting: Certified Nurse Midwife

## 2016-04-13 ENCOUNTER — Encounter: Payer: Self-pay | Admitting: Certified Nurse Midwife

## 2016-04-13 ENCOUNTER — Ambulatory Visit: Payer: BLUE CROSS/BLUE SHIELD | Admitting: Certified Nurse Midwife

## 2016-04-13 VITALS — BP 108/78 | HR 88 | Temp 98.0°F | Resp 16 | Ht 66.25 in | Wt 311.0 lb

## 2016-04-13 DIAGNOSIS — R102 Pelvic and perineal pain: Secondary | ICD-10-CM

## 2016-04-13 DIAGNOSIS — Z113 Encounter for screening for infections with a predominantly sexual mode of transmission: Secondary | ICD-10-CM

## 2016-04-13 DIAGNOSIS — R300 Dysuria: Secondary | ICD-10-CM

## 2016-04-13 DIAGNOSIS — N390 Urinary tract infection, site not specified: Secondary | ICD-10-CM

## 2016-04-13 LAB — POCT URINALYSIS DIPSTICK
BILIRUBIN UA: NEGATIVE
Blood, UA: NEGATIVE
Glucose, UA: NEGATIVE
KETONES UA: NEGATIVE
Nitrite, UA: NEGATIVE
PH UA: 6
PROTEIN UA: NEGATIVE
Urobilinogen, UA: NEGATIVE

## 2016-04-13 LAB — POCT URINE PREGNANCY: Preg Test, Ur: NEGATIVE

## 2016-04-13 MED ORDER — NITROFURANTOIN MONOHYD MACRO 100 MG PO CAPS: 100.0000 mg | ORAL_CAPSULE | Freq: Two times a day (BID) | ORAL | 0 refills | Status: DC

## 2016-04-13 NOTE — Patient Instructions (Signed)

## 2016-04-13 NOTE — Progress Notes (Signed)
30 y.o. Single Caucasian female G0P0000 here with urinary frequency and urgency for the past two weeks. Denies nausea, fever, chills, or headache or back pain. Has  complaint of vaginal symptoms of itching, burning, and increase discharge for the past few days. Treated self with Diflucan for itching a week ago with relief.Marland Kitchen.Describes discharge as white,no odor..Onset of symptoms 2 weeks ago. Denies new personal products. New partner and feels may be related to sexual activity also. Desires STD screening vaginal only. . Contraception is OCP. Patient has been treated for urinary frequency and urgency with Urgent care x 2 no urine culture or exam done. Patient concerned this keeps occurring. No other health issues today.   O:Healthy female WDWN Affect: normal, orientation x 3  Exam:Skin warm and dry CVAT positive left only Abdomen:soft, + suprapubic tenderness Lymph node: no enlargement or tenderness Pelvic exam: External genital: normal female, no lesions BS: negative Bladder,urethra tender, Urethral meatus tender and red Vagina: white slightly odorous discharge noted. Normal appearance,non tender  prep  Affirm taken Cervix: normal, non tender, no CMT Uterus: normal, non tender Adnexa:normal, non tender, no masses or fullness noted  A:Normal pelvic exam UTI symptomatic R/O vaginal infection STD screening   P:Discussed findings of UTI and  ? Post coital etiology.  Instructed to increase water, decrease caffeine, soda and tea. Warning signs of UTI given and need to advise. Discussed culture to determine source if possible and discussed possible post coital treatment, to avoid reoccurrence. Will advise when results in. Will treat per affirm if indicated. Labs:Urine culture, GC,chlamydia, Affirm   Rv prn

## 2016-04-14 LAB — WET PREP BY MOLECULAR PROBE
Candida species: NEGATIVE
GARDNERELLA VAGINALIS: NEGATIVE
TRICHOMONAS VAG: NEGATIVE

## 2016-04-14 LAB — URINE CULTURE

## 2016-04-15 LAB — IPS N GONORRHOEA AND CHLAMYDIA BY PCR

## 2016-04-17 NOTE — Progress Notes (Signed)
Encounter reviewed Jill Jertson, MD   

## 2016-05-03 ENCOUNTER — Encounter: Payer: Self-pay | Admitting: Certified Nurse Midwife

## 2016-05-03 ENCOUNTER — Ambulatory Visit (INDEPENDENT_AMBULATORY_CARE_PROVIDER_SITE_OTHER): Payer: BLUE CROSS/BLUE SHIELD | Admitting: Certified Nurse Midwife

## 2016-05-03 VITALS — BP 100/62 | HR 72 | Temp 98.5°F | Resp 16 | Ht 66.25 in | Wt 311.0 lb

## 2016-05-03 DIAGNOSIS — R3915 Urgency of urination: Secondary | ICD-10-CM

## 2016-05-03 DIAGNOSIS — Z87898 Personal history of other specified conditions: Secondary | ICD-10-CM | POA: Diagnosis not present

## 2016-05-03 DIAGNOSIS — B349 Viral infection, unspecified: Secondary | ICD-10-CM

## 2016-05-03 LAB — CBC WITH DIFFERENTIAL/PLATELET
BASOS ABS: 39 {cells}/uL (ref 0–200)
Basophils Relative: 1 %
EOS PCT: 1 %
Eosinophils Absolute: 39 cells/uL (ref 15–500)
HEMATOCRIT: 41.9 % (ref 35.0–45.0)
HEMOGLOBIN: 13.7 g/dL (ref 11.7–15.5)
LYMPHS ABS: 1092 {cells}/uL (ref 850–3900)
LYMPHS PCT: 28 %
MCH: 26.6 pg — AB (ref 27.0–33.0)
MCHC: 32.7 g/dL (ref 32.0–36.0)
MCV: 81.4 fL (ref 80.0–100.0)
MONO ABS: 663 {cells}/uL (ref 200–950)
MPV: 10 fL (ref 7.5–12.5)
Monocytes Relative: 17 %
NEUTROS PCT: 53 %
Neutro Abs: 2067 cells/uL (ref 1500–7800)
Platelets: 164 10*3/uL (ref 140–400)
RBC: 5.15 MIL/uL — ABNORMAL HIGH (ref 3.80–5.10)
RDW: 13.5 % (ref 11.0–15.0)
WBC: 3.9 10*3/uL (ref 3.8–10.8)

## 2016-05-03 LAB — POCT URINALYSIS DIPSTICK
Bilirubin, UA: NEGATIVE
Glucose, UA: NEGATIVE
KETONES UA: NEGATIVE
Nitrite, UA: NEGATIVE
PROTEIN UA: NEGATIVE
Urobilinogen, UA: NEGATIVE
pH, UA: 5

## 2016-05-03 MED ORDER — PHENAZOPYRIDINE HCL 100 MG PO TABS: 100.0000 mg | ORAL_TABLET | Freq: Three times a day (TID) | ORAL | 0 refills | Status: DC | PRN

## 2016-05-03 NOTE — Patient Instructions (Signed)
Kidney Stones °Kidney stones (urolithiasis) are deposits that form inside your kidneys. The intense pain is caused by the stone moving through the urinary tract. When the stone moves, the ureter goes into spasm around the stone. The stone is usually passed in the urine.  °CAUSES  °· A disorder that makes certain neck glands produce too much parathyroid hormone (primary hyperparathyroidism). °· A buildup of uric acid crystals, similar to gout in your joints. °· Narrowing (stricture) of the ureter. °· A kidney obstruction present at birth (congenital obstruction). °· Previous surgery on the kidney or ureters. °· Numerous kidney infections. °SYMPTOMS  °· Feeling sick to your stomach (nauseous). °· Throwing up (vomiting). °· Blood in the urine (hematuria). °· Pain that usually spreads (radiates) to the groin. °· Frequency or urgency of urination. °DIAGNOSIS  °· Taking a history and physical exam. °· Blood or urine tests. °· CT scan. °· Occasionally, an examination of the inside of the urinary bladder (cystoscopy) is performed. °TREATMENT  °· Observation. °· Increasing your fluid intake. °· Extracorporeal shock wave lithotripsy--This is a noninvasive procedure that uses shock waves to break up kidney stones. °· Surgery may be needed if you have severe pain or persistent obstruction. There are various surgical procedures. Most of the procedures are performed with the use of small instruments. Only small incisions are needed to accommodate these instruments, so recovery time is minimized. °The size, location, and chemical composition are all important variables that will determine the proper choice of action for you. Talk to your health care provider to better understand your situation so that you will minimize the risk of injury to yourself and your kidney.  °HOME CARE INSTRUCTIONS  °· Drink enough water and fluids to keep your urine clear or pale yellow. This will help you to pass the stone or stone fragments. °· Strain  all urine through the provided strainer. Keep all particulate matter and stones for your health care provider to see. The stone causing the pain may be as small as a grain of salt. It is very important to use the strainer each and every time you pass your urine. The collection of your stone will allow your health care provider to analyze it and verify that a stone has actually passed. The stone analysis will often identify what you can do to reduce the incidence of recurrences. °· Only take over-the-counter or prescription medicines for pain, discomfort, or fever as directed by your health care provider. °· Keep all follow-up visits as told by your health care provider. This is important. °· Get follow-up X-rays if required. The absence of pain does not always mean that the stone has passed. It may have only stopped moving. If the urine remains completely obstructed, it can cause loss of kidney function or even complete destruction of the kidney. It is your responsibility to make sure X-rays and follow-ups are completed. Ultrasounds of the kidney can show blockages and the status of the kidney. Ultrasounds are not associated with any radiation and can be performed easily in a matter of minutes. °· Make changes to your daily diet as told by your health care provider. You may be told to: °¨ Limit the amount of salt that you eat. °¨ Eat 5 or more servings of fruits and vegetables each day. °¨ Limit the amount of meat, poultry, fish, and eggs that you eat. °· Collect a 24-hour urine sample as told by your health care provider. You may need to collect another urine sample every 6-12   months. °SEEK MEDICAL CARE IF: °· You experience pain that is progressive and unresponsive to any pain medicine you have been prescribed. °SEEK IMMEDIATE MEDICAL CARE IF:  °· Pain cannot be controlled with the prescribed medicine. °· You have a fever or shaking chills. °· The severity or intensity of pain increases over 18 hours and is not  relieved by pain medicine. °· You develop a new onset of abdominal pain. °· You feel faint or pass out. °· You are unable to urinate. °  °This information is not intended to replace advice given to you by your health care provider. Make sure you discuss any questions you have with your health care provider. °  °Document Released: 07/18/2005 Document Revised: 04/08/2015 Document Reviewed: 12/19/2012 °Elsevier Interactive Patient Education ©2016 Elsevier Inc. ° °

## 2016-05-03 NOTE — Progress Notes (Signed)
30 y.o. Single Caucasian female G0P0000 here with urinary urgency( but has had this for the past 2 years and some throughout her life time). Was having some sweats, no fever or chills. Has not eaten this am,but has been drinking fluids. No nausea or vomiting, slight loose stool times one time. No history of kidney stones. ? Viral infection. Just feel achy with some joint pain. Busy with school, work and appointments. Denies pain with urination, just feels like I am not emptying. But this has occurred before and resolves..   Patient feels not related to sexual activity. No STD concerns. Denies any vaginal symptoms at this time.    Contraception is OCP, working well.. Patient is consuming adequate water intake. Finished Macrobid given on 04/13/16 for urinary frequency, urgency and pain. Felt some better. Urine culture positive for only skin pathogens. Denies pelvic pain. LMP 04/18/16,  Normal. No other concerns today.   O: Healthy female WDWN Affect: Normal, orientation x 3 Skin : warm and dry CVAT: negative bilateral Abdomen: slightly positive for suprapubic tenderness, positive  bowel sounds, no rebound, no point of tenderness  LSK: negative  Pelvic exam: External genital area: normal, no lesions Bladder? tender,Urethra not tender Urethral meatus:not  Tender, not red Vagina: normal vaginal discharge, normal appearance  Wet prep not taken Cervix: normal, non tender Uterus:normal,non tender Adnexa: normal non tender, no fullness or masses   A: UTI rule out Urinary urgency, chronic problem Normal pelvic exam ? Viral illness with fatigue  P: Reviewed findings of ? UTI vs chronic urgency. Discussed doing in and out cath for urinary volume to see if discomfort feels better. Patient agreeable, questions addressed. Also discussed possible stone which could be limiting her ability to empty bladder and may need Urology evaluation. Patient declines. Discussed trial of Pyridium to see if relieves urgency  while waiting on urine culture results. Patient would like to try. Feels relieved just to come in and discuss how she feels. Has another appointment today in [place] for learning disability. Plans to keep. Discussed normal pelvic exam findings. Discussed lab to rule out infection.  Lab: CBC with diff. VH:QIONGEXBRx:Pyridium MWU:XLKGMLab:Urine micro, culture Reviewed warning signs and symptoms of UTI/kidney stones and need to advise if occurring or seek Urgent care. Patient agreeable. Encouraged to limit soda, tea, and coffee and be sure to increase water intake. Discussed if overall discomfort increases needs to see urgent care or PCP. Patient agreeable.  Procedure : patient gave verbal consent and procedure was explained to patient.  Under sterile conditions Urethral meatus was visualized and cleansed x 3 with Betadine, small amount of lubricant on tip of catheter. Catheter inserted into meatus with quick clear urine return noted. Total of 34 ml. Noted from procedure. Catheter removed and discarded. Urine sent to lab. Patient tolerated procedure well with no pain.   RV prn as above

## 2016-05-04 ENCOUNTER — Telehealth: Payer: Self-pay

## 2016-05-04 NOTE — Telephone Encounter (Signed)
Patient notified of results. See lab 

## 2016-05-04 NOTE — Telephone Encounter (Signed)
-----   Message from Verner Choleborah S Leonard, CNM sent at 05/04/2016  7:49 AM EDT ----- Notify patient that her CBC with diff was essentially normal. Make sure you are drinking enough water and fluids. Urine culture pending Patient status

## 2016-05-04 NOTE — Progress Notes (Signed)
Encounter reviewed Loran Auguste, MD   

## 2016-05-04 NOTE — Telephone Encounter (Signed)
lmtcb

## 2016-05-05 LAB — URINE CULTURE: ORGANISM ID, BACTERIA: NO GROWTH

## 2016-05-31 ENCOUNTER — Encounter: Payer: Self-pay | Admitting: Certified Nurse Midwife

## 2016-05-31 ENCOUNTER — Ambulatory Visit (INDEPENDENT_AMBULATORY_CARE_PROVIDER_SITE_OTHER): Payer: BLUE CROSS/BLUE SHIELD | Admitting: Certified Nurse Midwife

## 2016-05-31 VITALS — BP 110/70 | HR 70 | Resp 16 | Ht 66.25 in | Wt 312.0 lb

## 2016-05-31 DIAGNOSIS — B372 Candidiasis of skin and nail: Secondary | ICD-10-CM

## 2016-05-31 MED ORDER — NYSTATIN-TRIAMCINOLONE 100000-0.1 UNIT/GM-% EX OINT: 1.0000 "application " | TOPICAL_OINTMENT | Freq: Two times a day (BID) | CUTANEOUS | 0 refills | Status: DC

## 2016-05-31 NOTE — Patient Instructions (Signed)

## 2016-05-31 NOTE — Progress Notes (Signed)
30 y.o. Single Caucasian female G0P0000 here with complaint of vaginal symptoms of itching, burning,around rectum and lower part of vulva. Recent treatment for strep throat with Z pack and then amoxicillin. Had one Diflucan at home and treated internal symptoms and now external symptoms only. Onset of symptoms 3-4 days ago. Denies new personal products. Has probiotics, but has not started them. "Feels so much better". No  STD concerns. Urinary symptoms none . Contraception is OCP. No other health issues today.   O:Healthy female WDWN Affect: normal, orientation x 3  Exam: Abdomen:soft, non tender Inguinal Lymph nodes: no enlargement or tenderness Pelvic exam: External genital: normal female, with increase pink at outside of vulva area near introitus.  BUS: negative Vagina: normal discharge noted. Ph:4.0   ,Wet prep taken,  Cervix: normal, non tender, no CMT Uterus: normal, non tender Adnexa:normal, non tender, no masses or fullness noted Rectal area: slight redness, and scaling with slight exudate, wet prep taken today.  Wet Prep results: KOH,Saline positive yeast external only Vaginal:  negative   A:Normal pelvic exam Yeast dermatitis  P:Discussed findings of yeast dermatitis and etiology from probable antibiotic use .Discussed Aveeno or baking soda sitz bath for comfort. Avoid moist clothes  for extended period of time. If working out in gym clothes for long periods of time, change underwear or bottoms  if possible. Coconut  Rx: Mycolog ointment see order with instructions Start oral probiotic again to restore normal GI flora.  Rv prn

## 2016-06-06 NOTE — Progress Notes (Signed)
Encounter reviewed Derric Dealmeida, MD   

## 2016-06-15 ENCOUNTER — Telehealth (HOSPITAL_COMMUNITY): Payer: Self-pay | Admitting: Psychiatry

## 2016-06-15 ENCOUNTER — Other Ambulatory Visit: Payer: Self-pay | Admitting: Certified Nurse Midwife

## 2016-06-15 DIAGNOSIS — Z3041 Encounter for surveillance of contraceptive pills: Secondary | ICD-10-CM

## 2016-06-15 NOTE — Telephone Encounter (Signed)
Pt had an appt on 11/21 that we had to cx due to provider being out of office.   Pt is calling because she needs a refill on lexapro. She has 10 pills left. However pt will not be able to come in and see us at this time because her insurance will not cover these visits with Gilmore LarocheAkhtar. She is going to go see her PCP on 12/12. She believes that her pcp will write the rx at that time. Can we write her enough to last until that visit or 1 more rx.   Please advise.

## 2016-06-16 NOTE — Telephone Encounter (Signed)
Yes can give lexapro as she requested.

## 2016-06-17 MED ORDER — ESCITALOPRAM OXALATE 10 MG PO TABS: ORAL_TABLET | ORAL | 0 refills | Status: DC

## 2016-06-17 NOTE — Telephone Encounter (Signed)
Per Dr. Gilmore LarocheAkhtar, prescription for Lexapro 10mg , #30 [facility]. lvm informing pt of refill status.

## 2016-06-21 ENCOUNTER — Ambulatory Visit (HOSPITAL_COMMUNITY): Payer: Self-pay | Admitting: Psychiatry

## 2016-06-28 ENCOUNTER — Telehealth: Payer: Self-pay | Admitting: Certified Nurse Midwife

## 2016-06-28 NOTE — Telephone Encounter (Signed)
Left message to call Dania Marsan at [phone].  

## 2016-06-28 NOTE — Telephone Encounter (Signed)
Patient was in a few weeks ago and her yeast infection symptoms are worse.

## 2016-06-29 ENCOUNTER — Other Ambulatory Visit: Payer: Self-pay | Admitting: Certified Nurse Midwife

## 2016-06-29 DIAGNOSIS — N761 Subacute and chronic vaginitis: Secondary | ICD-10-CM

## 2016-06-29 MED ORDER — FLUCONAZOLE 150 MG PO TABS: 150.0000 mg | ORAL_TABLET | Freq: Once | ORAL | 0 refills | Status: AC

## 2016-06-29 NOTE — Telephone Encounter (Signed)
Will have her do Diflucan one and then repeat one in 5 days, if no change or symptoms not resolved in 2 weeks will need an OV Order placed

## 2016-06-29 NOTE — Telephone Encounter (Signed)
Left message to call Hadlei Stitt at [phone].  

## 2016-06-29 NOTE — Telephone Encounter (Signed)
Spoke with patient, advised as seen below per Deborah Leonard, CNM. Patient verbalizes understanding and is agreeable.   Routing to provider for final review. Patient is agreeable to disposition. Will close encounter.  

## 2016-06-29 NOTE — Telephone Encounter (Signed)
Spoke with patient. Patient states she was seen in office 05/31/16 for yeast with Jessica Castro, CNM. Patient states she feel yeast is not getting any better. Patient reports vaginal itching, redness, white vaginal discharge with little odor. Patient states she stopped using mycolog cream as she felt it was making symptoms worse. Denies aveeno/baking soda soaks due to no bathtub. Patient states she stopped the probiotic when she ran out. Denies urinary complaints. Patient states she feels she needs a prescription for diflucan to make her feel better. Advised patient she will need OV for further evaluation. Patient states she has seen Jessica Castro, CNM quite a few times recently and OV are getting very expensive. Patient would like to know Jessica Castro, CNM recommendations. Advised will review with Jessica Castro, CNM for recommendations and return call. Patient is agreeable.   Jessica Castro, CNM -please advise?

## 2016-08-15 ENCOUNTER — Telehealth: Payer: Self-pay | Admitting: Certified Nurse Midwife

## 2016-08-15 NOTE — Telephone Encounter (Signed)
Patient called requesting to speak with the nurse about her birth control. °

## 2016-08-15 NOTE — Telephone Encounter (Signed)
Spoke with patient. Patient states she has recently switched pharmacies and the last OCP that was filled was filled as Junel. Patient states Junel causes itching. Patient states itching has been ongoing for a month. Patient reports trying Junel 8 years ago and had the same side effect.  Patient would like to know if OCP can be filled as Loestrin Fe as she has taken this prior with no problems? Advised patient to f/u with pharmacy and request OCP be filled as Brand Loestrin Fe. Advised patient to return call if any additional questions. Patient verbalizes understanding and is agreeable.  Ria CommentPatricia Grubb, NP -any additional recommendations?  Cc: Leota Sauerseborah Leonard, CNM

## 2016-08-15 NOTE — Telephone Encounter (Signed)
No lets try to get brand name only.

## 2016-09-19 ENCOUNTER — Encounter: Payer: Self-pay | Admitting: Certified Nurse Midwife

## 2016-09-20 ENCOUNTER — Telehealth: Payer: Self-pay | Admitting: *Deleted

## 2016-09-20 NOTE — Telephone Encounter (Signed)
Jessica Castro, CNM -see MyChart message below and advise?  From Han Rovito To Verner Choleborah S Castro, CNM Sent 09/19/2016 10:54 AM  Hello!   I have a question about STD testing. [facility] being with a new partner. For some reason, my new partner is paranoid about Herpes because I told him that I have had unprotected sex in the past. I do NOT have any symptoms. However, when [facility], they informed me that they do not recommend Herpes testing per opinion of the CDC that it isn't really all that helpful for various reasons. I looked at my online chart and saw that I was tested for Herpes in 2016 but not when I went for testing with you in 2017. Is this your recommendation as well or should I come in to have this done again?   Thanks!   Eugenia PancoastKatie Batres

## 2016-09-20 NOTE — Telephone Encounter (Signed)
Telephone encounter created 09/20/16 to review with provider.

## 2016-09-21 NOTE — Telephone Encounter (Signed)
Left message to call Kaitlyn at [phone]. 

## 2016-09-21 NOTE — Telephone Encounter (Signed)
Spoke with patient. Advised of message as seen below from PepsiCoDeborah Leonard CNM. Patient verbalizes understanding. Patient does not desire to have testing at this time. Will call back if she would like to have this performed.  Routing to provider for final review. Patient agreeable to disposition. Will close encounter.

## 2016-09-21 NOTE — Telephone Encounter (Signed)
Herpes testing can be confusing because you may have a positive response but never an outbreak. This is very common. Routine screening is not recommended, but with concerns with partner or partner has  Or has had herpes this may be very helpful to the patient.

## 2017-01-13 ENCOUNTER — Ambulatory Visit (INDEPENDENT_AMBULATORY_CARE_PROVIDER_SITE_OTHER): Payer: BLUE CROSS/BLUE SHIELD | Admitting: Certified Nurse Midwife

## 2017-01-13 ENCOUNTER — Encounter: Payer: Self-pay | Admitting: Certified Nurse Midwife

## 2017-01-13 VITALS — BP 108/64 | HR 68 | Resp 16 | Ht 66.25 in | Wt 317.0 lb

## 2017-01-13 DIAGNOSIS — B373 Candidiasis of vulva and vagina: Secondary | ICD-10-CM

## 2017-01-13 DIAGNOSIS — B3731 Acute candidiasis of vulva and vagina: Secondary | ICD-10-CM

## 2017-01-13 MED ORDER — FLUCONAZOLE 150 MG PO TABS: 150.0000 mg | ORAL_TABLET | Freq: Once | ORAL | 0 refills | Status: AC

## 2017-01-13 MED ORDER — NYSTATIN 100000 UNIT/GM EX CREA: 1.0000 "application " | TOPICAL_CREAM | Freq: Two times a day (BID) | CUTANEOUS | 0 refills | Status: DC

## 2017-01-13 NOTE — Progress Notes (Signed)
31 y.o. Single Caucasian female G0P0000 here with complaint of vaginal symptoms of itching, burning, and slight increase discharge. Describes discharge as white. Some ? Cracking with occasional bleeding. Recently sexually active with condom use, ? Related to condom. Onset of symptoms 3-4 days ago. Denies new personal products or vaginal dryness. no STD concerns. Urinary symptoms none . Contraception is OCP. No other health issues. Stress with new job and [employer].  ROS  Pertinent to HPI  O:Healthy female WDWN Affect: normal, orientation x 3  Exam: Abdomen: non tender  Inguinal Lymph nodes: no enlargement or tenderness Pelvic exam: External genital: normal female, small cracking noted bilateral inside of labia minora, no bleeding or lesions, patient acknowledges this is the area of concern BUS: negative Vagina: white thick discharge noted. Ph: 4.0   ,Wet prep taken, Cervix: normal, non tender, no CMT Uterus: normal, non tender Adnexa:normal, non tender, no masses or fullness noted   Wet Prep results:KOH,Saline + yeast  only   A:Normal pelvic exam Yeast vaginitis/vulvitis   P:Discussed findings of yeast vaginitis/vulvitis and etiology. Discussed Aveeno or baking soda sitz bath for comfort. Avoid moist clothes or pads for extended period of time. If working out in gym clothes or swim suits for long periods of time change underwear or bottoms of swimsuit if possible. Coconut Oil use for skin protection prior to activity can be used to external skin for protection or dryness. Discussed female condom use if female condoms are irritating or try different brand. Questions addressed  Rx: Diflucan see order with instructions Rx Nystatin cream see order with instructions and obtain OTC 1% hydrocortisone cream and mix with Nystatin and apply to vulva area.   Rv prn

## 2017-01-13 NOTE — Patient Instructions (Signed)

## 2017-01-14 IMAGING — CR DG FOOT 2V*L*
2 series · 2 of 2 positions shown · non-contrast
Comparison: None.

CLINICAL DATA: Dorsal left foot injury 7 months prior with
persistent third metatarsal region pain in the left foot.

EXAM:
LEFT FOOT - 2 VIEW

[AP]
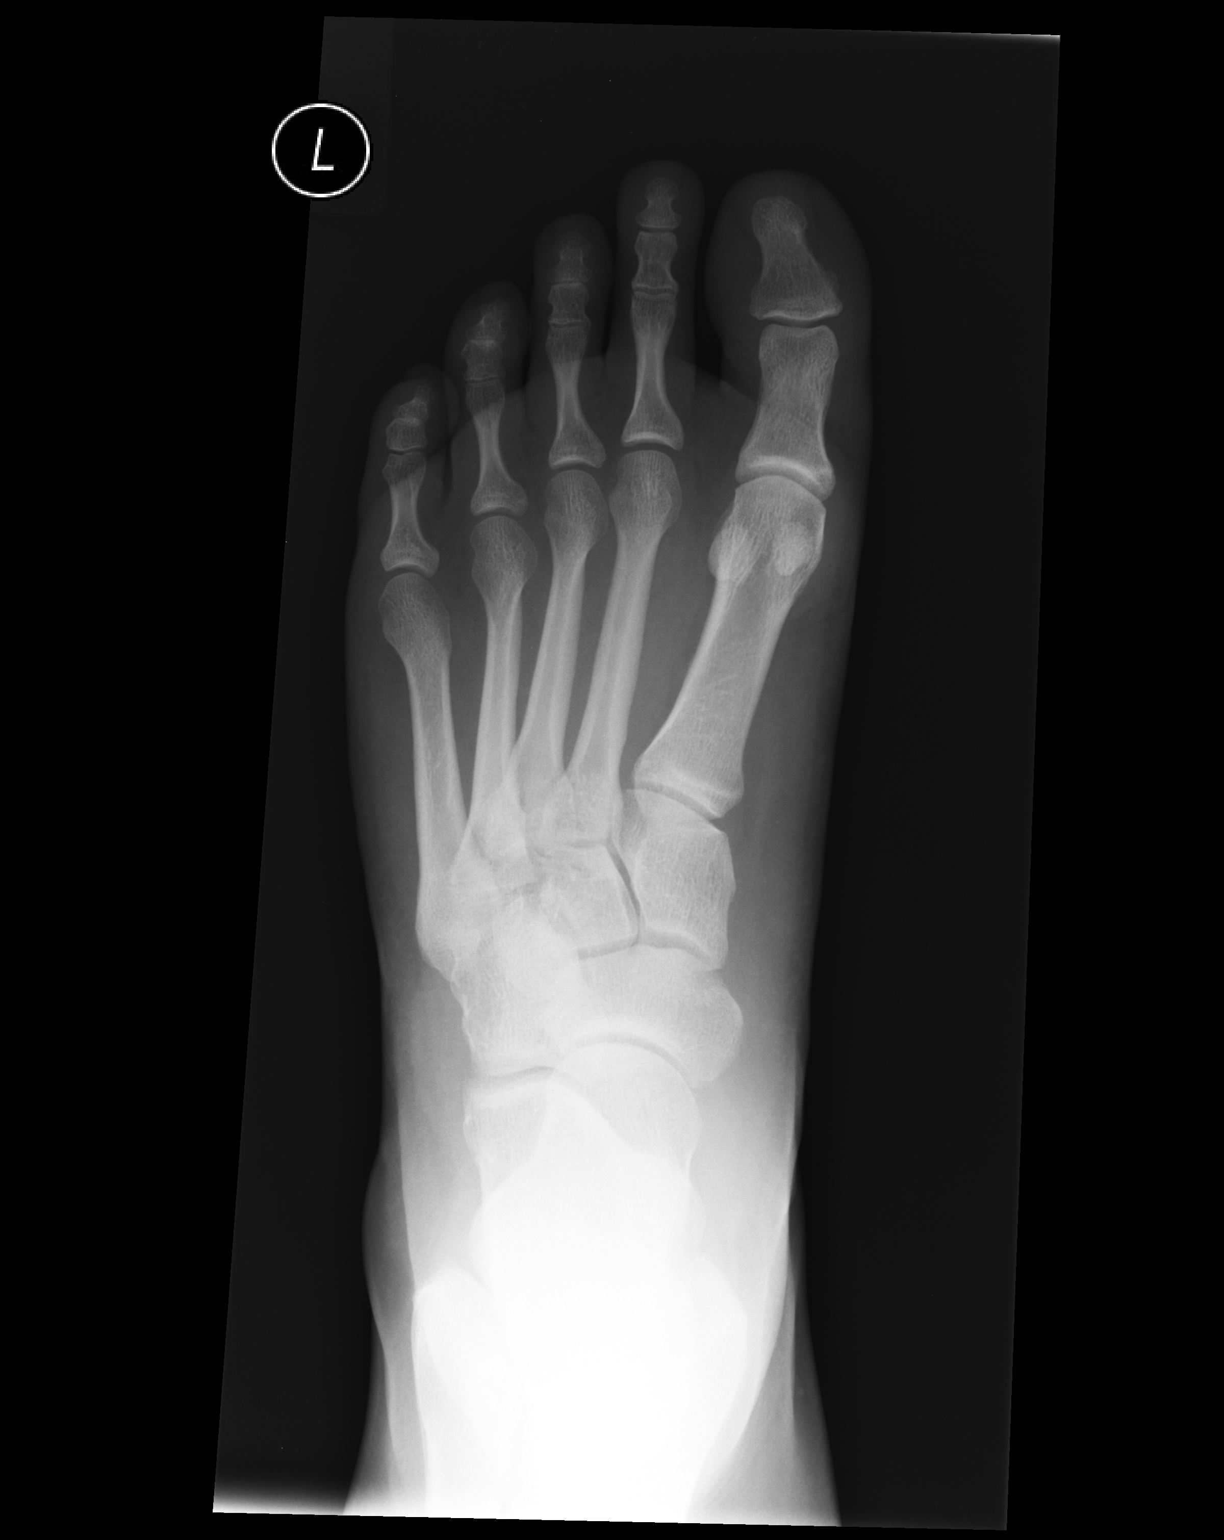

[lateral]
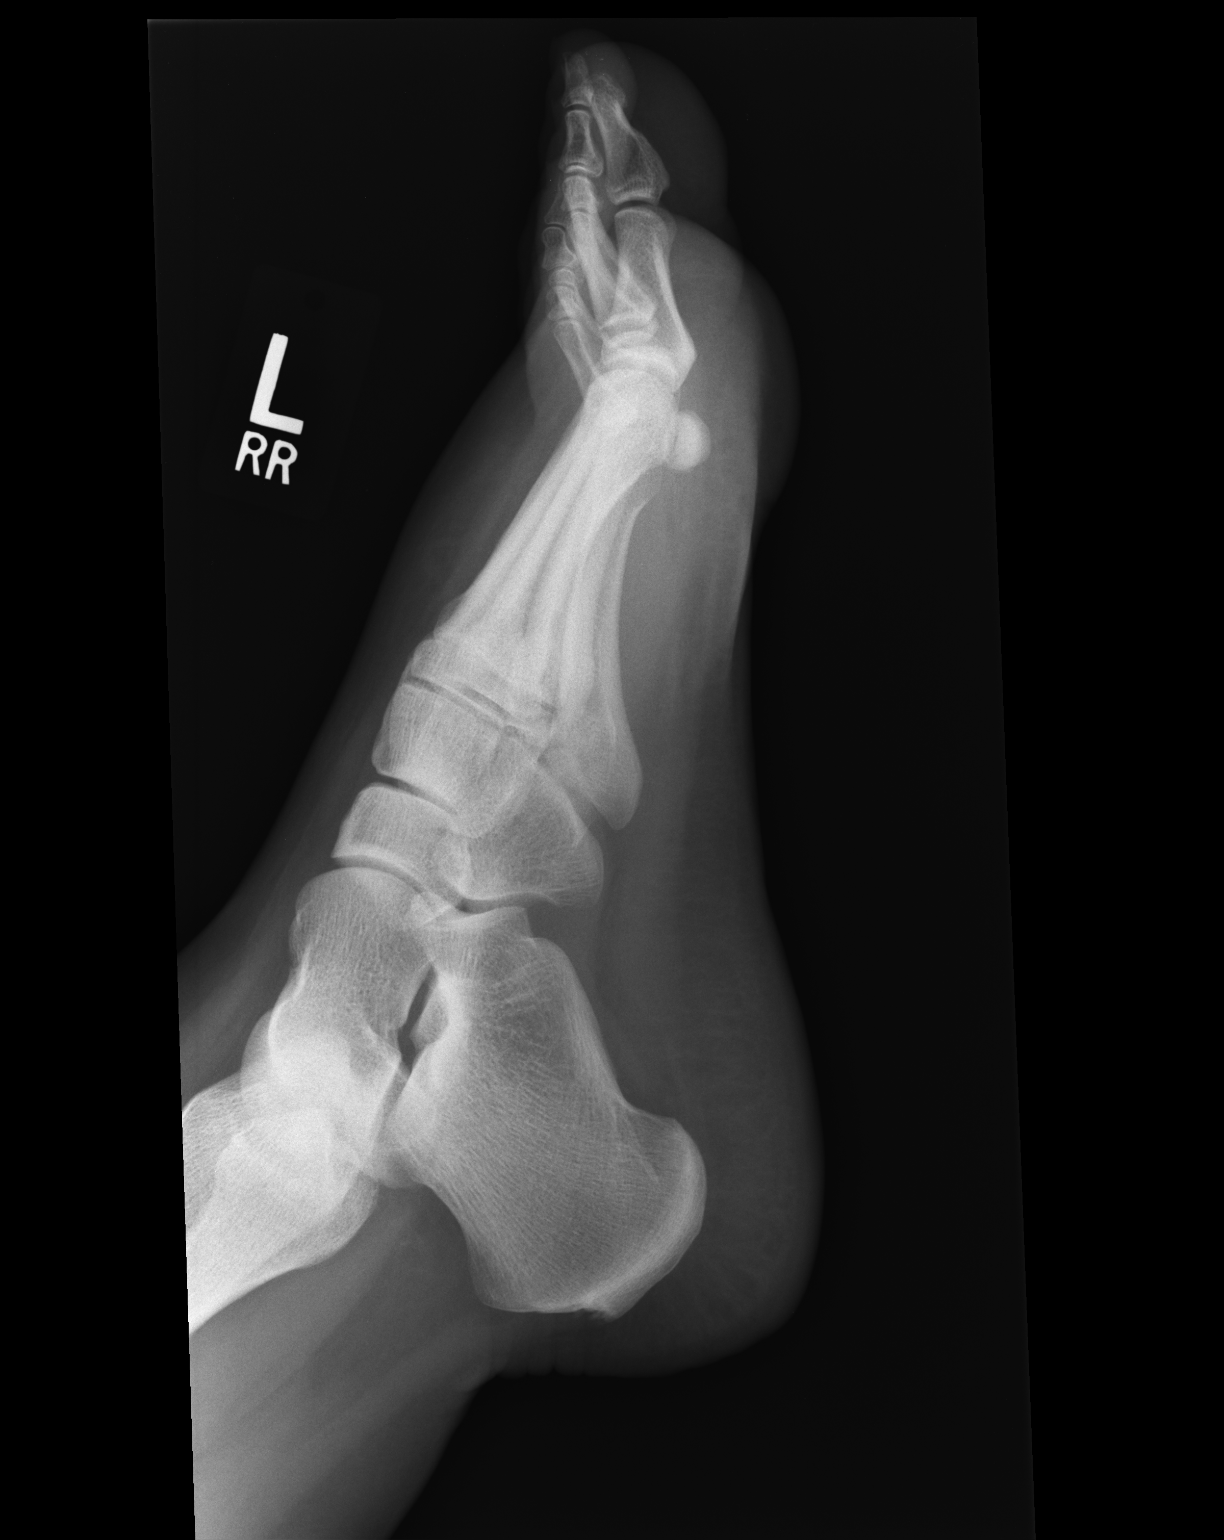

[2 of 2 positions shown; findings below may reference images not displayed]

FINDINGS: No fracture, dislocation or suspicious focal osseous lesion. No
evidence of degenerative or erosive arthropathy. Tiny Achilles left
calcaneal spur.
IMPRESSION: Tiny Achilles left calcaneal spur. Otherwise normal left foot
radiographs.

## 2017-02-09 ENCOUNTER — Other Ambulatory Visit (HOSPITAL_COMMUNITY)
Admission: RE | Admit: 2017-02-09 | Discharge: 2017-02-09 | Disposition: A | Discharge status: Home / Self Care | Payer: BLUE CROSS/BLUE SHIELD | Source: Ambulatory Visit | Attending: Certified Nurse Midwife | Admitting: Certified Nurse Midwife

## 2017-02-09 ENCOUNTER — Encounter: Payer: Self-pay | Admitting: Certified Nurse Midwife

## 2017-02-09 ENCOUNTER — Ambulatory Visit (INDEPENDENT_AMBULATORY_CARE_PROVIDER_SITE_OTHER): Payer: BLUE CROSS/BLUE SHIELD | Admitting: Certified Nurse Midwife

## 2017-02-09 VITALS — BP 110/70 | HR 70 | Resp 16 | Ht 67.0 in | Wt 317.0 lb

## 2017-02-09 DIAGNOSIS — Z3041 Encounter for surveillance of contraceptive pills: Secondary | ICD-10-CM | POA: Diagnosis not present

## 2017-02-09 DIAGNOSIS — Z01419 Encounter for gynecological examination (general) (routine) without abnormal findings: Secondary | ICD-10-CM | POA: Insufficient documentation

## 2017-02-09 DIAGNOSIS — Z124 Encounter for screening for malignant neoplasm of cervix: Secondary | ICD-10-CM | POA: Diagnosis not present

## 2017-02-09 MED ORDER — NORETHIN ACE-ETH ESTRAD-FE 1-20 MG-MCG PO TABS: 1.0000 | ORAL_TABLET | Freq: Every day | ORAL | 4 refills | Status: DC

## 2017-02-09 NOTE — Patient Instructions (Signed)

## 2017-02-09 NOTE — Progress Notes (Signed)
31 y.o. G0P0000 Single  Caucasian Fe here for annual exam. Periods normal, no issues. Contraception working well.  No STD concerns or screening needed. Would like to make sure no vaginal yeast, since recent treatment. Sees PCP for labs and medication management for depression and glucose. All stable. Has aex soon with labs. Considering gastric-sleeve surgery for weight loss. Has researched and feel this would help her health wise. Plans referral from PCP to [facility]. No other health issues today.  Patient's last menstrual period was 01/23/2017 (exact date).          Sexually active: Yes.    The current method of family planning is OCP (estrogen/progesterone).    Exercising: No.  exercise Smoker:  no  Health Maintenance: Pap:  02-18-14 neg History of Abnormal Pap: no MMG:  none Self Breast exams: occ Colonoscopy:  none BMD:   none TDaP:  2015 Shingles: no Pneumonia: no Hep C and HIV: both neg 2017 Labs: none   reports that she has never smoked. She has never used smokeless tobacco. She reports that she drinks about 0.6 - 1.2 oz of alcohol per week . She reports that she uses drugs, including Marijuana.  Past Medical History:  Diagnosis Date  . Allergic rhinitis    Allergy- shots with Dr Sharyn Lull  . Allergy   . Amenorrhea   . Anxiety    has a history of panic disorder  . Asthma   . Depression    treated with anxiety- current  counseling   . GERD (gastroesophageal reflux disease)   . Herniated disc    back  . History of bronchitis   . History of chicken pox    childhood  . History of headache   . History of migraines   . Menorrhagia   . PCOS (polycystic ovarian syndrome)     Past Surgical History:  Procedure Laterality Date  . WISDOM TOOTH EXTRACTION  2003    Current Outpatient Prescriptions  Medication Sig Dispense Refill  . albuterol (PROVENTIL HFA;VENTOLIN HFA) 108 (90 BASE) MCG/ACT inhaler Inhale 2 inhaltions tid for wheezing.  Use 2 inhlations prior to exercise 1  Inhaler 0  . escitalopram (LEXAPRO) 10 MG tablet TAKE 0.5 TABLET (5 MG TOTAL) BY MOUTH DAILY 30 tablet 0  . levocetirizine (XYZAL) 5 MG tablet Take 5 mg by mouth every evening.    . metFORMIN (GLUCOPHAGE-XR) 500 MG 24 hr tablet TK 1 T PO D  3  . norethindrone-ethinyl estradiol (JUNEL FE,GILDESS FE,LOESTRIN FE) 1-20 MG-MCG tablet Take 1 tablet by mouth daily. 1 Package 12  . nystatin cream (MYCOSTATIN) Apply 1 application topically 2 (two) times daily. Apply to affected area BID for up to 7 days. 30 g 0  . omeprazole (PRILOSEC) 40 MG capsule Take 40 mg by mouth daily.  1   No current facility-administered medications for this visit.     Family History  Problem Relation Age of Onset  . Alcohol abuse Mother   . Hyperlipidemia Mother   . Heart disease Mother   . Anxiety disorder Mother   . Hyperlipidemia Father   . Diabetes Father   . Anxiety disorder Sister   . Hyperlipidemia Maternal Grandmother   . Alzheimer's disease Maternal Grandmother   . Pancreatic cancer Paternal Grandfather   . Stroke Maternal Grandfather   . Alcohol abuse Maternal Grandfather     ROS:  Pertinent items are noted in HPI.  Otherwise, a comprehensive ROS was negative.  Exam:   BP 110/70  Pulse 70   Resp 16   Ht 5\' 7"  (1.702 m)   Wt (!) 317 lb (143.8 kg)   LMP 01/23/2017 (Exact Date)   BMI 49.65 kg/m  Height: 5\' 7"  (170.2 cm) Ht Readings from Last 3 Encounters:  02/09/17 5\' 7"  (1.702 m)  01/13/17 5' 6.25" (1.683 m)  05/31/16 5' 6.25" (1.683 m)    General appearance: alert, cooperative and appears stated age Head: Normocephalic, without obvious abnormality, atraumatic Neck: no adenopathy, supple, symmetrical, trachea midline and thyroid normal to inspection and palpation Lungs: clear to auscultation bilaterally Breasts: normal appearance, no masses or tenderness, No nipple retraction or dimpling, No nipple discharge or bleeding, No axillary or supraclavicular adenopathy Heart: regular rate and  rhythm Abdomen: soft, non-tender; no masses,  no organomegaly Extremities: extremities normal, atraumatic, no cyanosis or edema Skin: Skin color, texture, turgor normal. No rashes or lesions Lymph nodes: Cervical, supraclavicular, and axillary nodes normal. No abnormal inguinal nodes palpated Neurologic: Grossly normal   Pelvic: External genitalia:  no lesions              Urethra:  normal appearing urethra with no masses, tenderness or lesions              Bartholin's and Skene's: normal                 Vagina: normal appearing vagina with normal color and discharge, no lesions              Cervix: no cervical motion tenderness, no lesions, nulliparous appearance and very posterior to see              Pap taken: Yes.   Bimanual Exam:  Uterus:  normal size, contour, position, consistency, mobility, non-tender              Adnexa: normal adnexa and no mass, fullness, tenderness  Limited by body habitus               Rectovaginal: Confirms               Anus:  normal sphincter tone, no lesions  Chaperone present: yes  A:  Well Woman with normal exam  Contraception OCP working well desires continuance  Vaginal yeast history screening requested  History of PCOS with glucose management and depression management with PCP  Morbid obesity with failed attempts at weight loss, considering gastric sleeve with nutritional counseling support  P:   Reviewed health and wellness pertinent to exam  Rx Junel 1/20 see order with instructions  Lab: with pap yeast,trichomonas, BV, GC/Chlamydia  Continue follow up with PCP as indicated  Encouraged patient to consider all the risks and benefits prior to make decision after consultation.  Pap smear: yes   counseled on breast self exam, STD prevention, HIV risk factors and prevention, use and side effects of OCP's, adequate intake of calcium and vitamin D, diet and exercise  return annually or prn  An After Visit Summary was printed and given to the  patient.

## 2017-02-13 LAB — CYTOLOGY - PAP
Bacterial vaginitis: NEGATIVE
CHLAMYDIA, DNA PROBE: NEGATIVE
Candida vaginitis: NEGATIVE
DIAGNOSIS: NEGATIVE
HPV 16/18/45 GENOTYPING: NEGATIVE
HPV: DETECTED — AB
Neisseria Gonorrhea: NEGATIVE
Trichomonas: NEGATIVE

## 2017-02-14 ENCOUNTER — Other Ambulatory Visit: Payer: Self-pay | Admitting: Certified Nurse Midwife

## 2017-02-14 DIAGNOSIS — Z124 Encounter for screening for malignant neoplasm of cervix: Secondary | ICD-10-CM

## 2017-03-24 DIAGNOSIS — E7800 Pure hypercholesterolemia, unspecified: Secondary | ICD-10-CM | POA: Diagnosis not present

## 2017-03-30 DIAGNOSIS — R5383 Other fatigue: Secondary | ICD-10-CM | POA: Diagnosis not present

## 2017-03-30 DIAGNOSIS — R0683 Snoring: Secondary | ICD-10-CM | POA: Diagnosis not present

## 2017-05-03 DIAGNOSIS — G478 Other sleep disorders: Secondary | ICD-10-CM | POA: Diagnosis not present

## 2017-05-03 DIAGNOSIS — R0683 Snoring: Secondary | ICD-10-CM | POA: Diagnosis not present

## 2017-06-05 DIAGNOSIS — G4733 Obstructive sleep apnea (adult) (pediatric): Secondary | ICD-10-CM | POA: Diagnosis not present

## 2017-06-07 DIAGNOSIS — F32A Depression, unspecified: Secondary | ICD-10-CM | POA: Insufficient documentation

## 2017-06-07 DIAGNOSIS — R030 Elevated blood-pressure reading, without diagnosis of hypertension: Secondary | ICD-10-CM | POA: Diagnosis not present

## 2017-06-07 DIAGNOSIS — F329 Major depressive disorder, single episode, unspecified: Secondary | ICD-10-CM | POA: Insufficient documentation

## 2017-06-15 DIAGNOSIS — Z7189 Other specified counseling: Secondary | ICD-10-CM | POA: Diagnosis not present

## 2017-07-07 ENCOUNTER — Other Ambulatory Visit: Payer: Self-pay | Admitting: Surgery

## 2017-07-07 ENCOUNTER — Other Ambulatory Visit (HOSPITAL_COMMUNITY): Payer: Self-pay | Admitting: Surgery

## 2017-07-13 ENCOUNTER — Ambulatory Visit: Payer: BLUE CROSS/BLUE SHIELD

## 2017-07-13 DIAGNOSIS — Z0000 Encounter for general adult medical examination without abnormal findings: Secondary | ICD-10-CM | POA: Diagnosis not present

## 2017-07-18 ENCOUNTER — Ambulatory Visit (HOSPITAL_COMMUNITY): Payer: BLUE CROSS/BLUE SHIELD

## 2017-07-31 ENCOUNTER — Ambulatory Visit (HOSPITAL_COMMUNITY)
Admission: RE | Admit: 2017-07-31 | Discharge: 2017-07-31 | Disposition: A | Discharge status: Home / Self Care | Payer: 59 | Source: Ambulatory Visit | Attending: Surgery | Admitting: Surgery

## 2017-07-31 ENCOUNTER — Other Ambulatory Visit: Payer: Self-pay

## 2017-07-31 DIAGNOSIS — Z01810 Encounter for preprocedural cardiovascular examination: Secondary | ICD-10-CM | POA: Insufficient documentation

## 2017-07-31 DIAGNOSIS — K449 Diaphragmatic hernia without obstruction or gangrene: Secondary | ICD-10-CM | POA: Insufficient documentation

## 2017-07-31 DIAGNOSIS — R9431 Abnormal electrocardiogram [ECG] [EKG]: Secondary | ICD-10-CM | POA: Insufficient documentation

## 2017-07-31 DIAGNOSIS — Z01818 Encounter for other preprocedural examination: Secondary | ICD-10-CM | POA: Diagnosis not present

## 2017-08-09 ENCOUNTER — Ambulatory Visit: Payer: Self-pay | Admitting: Registered"

## 2017-08-14 ENCOUNTER — Ambulatory Visit: Payer: Self-pay | Admitting: Registered"

## 2017-08-18 ENCOUNTER — Encounter: Payer: Self-pay | Admitting: Registered"

## 2017-08-18 ENCOUNTER — Encounter: Payer: 59 | Attending: Surgery | Admitting: Registered"

## 2017-08-18 DIAGNOSIS — Z713 Dietary counseling and surveillance: Secondary | ICD-10-CM | POA: Insufficient documentation

## 2017-08-18 DIAGNOSIS — E669 Obesity, unspecified: Secondary | ICD-10-CM

## 2017-08-18 NOTE — Progress Notes (Signed)
Pre-Op Assessment Visit:  Pre-Operative Sleeve gastrectomy Surgery  Medical Nutrition Therapy:  Appt start time: 12:30  End time: 1:16  Patient was seen on 08/18/2017 for Pre-Operative Nutrition Assessment. Assessment and [facility] coordinator on 08/18/2017.   Pt expectation of surgery: tool to lose weight, increase energy, reduce medications, be healthier mentally/physically, improve back pain  Pt expectation of Dietitian: suggestions, helpful tips, education on what happens after surgery, education  Start weight at NDES: 310.7 BMI: 49.40   Co-morbidities: hypertension, hypercholesterolemia, sleep apnea, acid reflux Pt states she takes anti-depressants and needs them to get through the day. Pt states Coke is her trigger. Pt states she does not drink a lot of fluids throughout her day. Pt states her meals depend on how stressed she is. 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 Pt states she eats out for all of her meals. Pt states she has PCOS.   Per insurance, pt needs 6 SWL visits prior to surgery.    24 hr Dietary Recall: First Meal: sometimes skips; eggs, peanut butter toast Snack: none Second Meal: sometimes skips; Jimmy John's or  Ghassan's, or Sonic-cheeseburger Snack: none Third Meal: Vietnamese-beef, green beans Snack: cheesecake Beverages: diet coke, Coke, ginger ale, water, coffee  Encouraged to engage in 150 minutes of moderate physical activity including cardiovascular and weight baring weekly  Handouts given during visit include:  . Pre-Op Goals . Bariatric Surgery Protein Shakes . Vitamin and Mineral Recommendations  During the appointment today the following Pre-Op Goals were reviewed with the patient: . Track your food and beverage: MyFitness Pal or Baritastic App . Make healthy food choices . Begin to limit portion sizes . Limited concentrated sugars  and fried foods . Keep fat/sugar in the single digits per serving on          food labels . Practice CHEWING your food  (aim for 30 chews per bite or until applesauce consistency) . Practice not drinking 15 minutes before, during, and 30 minutes after each meal/snack . Avoid all carbonated beverages  . Avoid/limit caffeinated beverages  . Avoid all sugar-sweetened beverages . Avoid alcohol . Consume 3 meals per day; eat every 3-5 hours . Make a list of non-food related activities . Aim for 64-100 ounces of FLUID daily  . Aim for at least 60-80 grams of PROTEIN daily . Look for a liquid protein source that contain ?15 g protein and ?5 g carbohydrate  (ex: shakes, drinks, shots) . Physical activity is an important part of a healthy lifestyle so keep it moving!  Follow diet recommendations listed below Energy and Macronutrient Recommendations: Calories: 2000 Carbohydrate: 225 Protein: 150 Fat: 56  Demonstrated degree of understanding via:  Teach Back   Teaching Method Utilized:  Visual Auditory Hands on  Barriers to learning/adherence to lifestyle change: work-life balance  Patient to call the Nutrition and Diabetes Education Services to enroll in Pre-Op and Post-Op Nutrition Education when surgery date is scheduled.

## 2017-09-12 NOTE — Progress Notes (Deleted)
32 y.o. Single {Race/ethnicity:17218} female G0P0000 here with complaint of vaginal symptoms of itching, burning, and increase discharge. Describes discharge as ***. Onset of symptoms *** days ago. Denies new personal products or vaginal dryness. *** STD concerns. Urinary symptoms *** . Contraception is  ROS  O:Healthy female WDWN Affect: normal, orientation x 3  Exam: Abdomen: Lymph node: no enlargement or tenderness Pelvic exam: External genital: normal female BUS: negative Vagina: *** discharge noted. Ph:   ,Wet prep taken, Affirm taken Cervix: normal, non tender, no CMT Uterus: normal, non tender Adnexa:normal, non tender, no masses or fullness noted   Wet Prep results:   A:Normal pelvic exam   P:Discussed findings of *** and etiology. Discussed Aveeno or baking soda sitz bath for comfort. Avoid moist clothes or pads for extended period of time. If working out in gym clothes or swim suits for long periods of time change underwear or bottoms of swimsuit if possible. Coconut Oil use for skin protection prior to activity can be used to external skin for protection or dryness. Rx: ***  Rv prn

## 2017-09-13 ENCOUNTER — Ambulatory Visit: Payer: BLUE CROSS/BLUE SHIELD | Admitting: Certified Nurse Midwife

## 2017-09-13 ENCOUNTER — Encounter: Payer: Self-pay | Admitting: Certified Nurse Midwife

## 2017-09-13 ENCOUNTER — Other Ambulatory Visit: Payer: Self-pay

## 2017-09-13 ENCOUNTER — Ambulatory Visit: Payer: 59 | Admitting: Certified Nurse Midwife

## 2017-09-13 VITALS — BP 110/62 | HR 68 | Resp 16 | Ht 67.0 in | Wt 311.0 lb

## 2017-09-13 DIAGNOSIS — B372 Candidiasis of skin and nail: Secondary | ICD-10-CM

## 2017-09-13 DIAGNOSIS — Z113 Encounter for screening for infections with a predominantly sexual mode of transmission: Secondary | ICD-10-CM

## 2017-09-13 MED ORDER — NYSTATIN-TRIAMCINOLONE 100000-0.1 UNIT/GM-% EX OINT: TOPICAL_OINTMENT | CUTANEOUS | 1 refills | Status: DC

## 2017-09-13 NOTE — Progress Notes (Signed)
32 y.o. Single Caucasian female G0P0000 here with complaint of vaginal symptoms of itching, burning, and increase discharge and some irritation around anal opening and surrounding area.Marland Kitchen. "Feels like I might have hemorrhoids. Uses the Tucks wipes at times with some relief. No anal sex recently. Has had partner change and needs STD screening. Onset of symptoms 2-3 weeks ago. Denies new personal products.No STD concerns. Urinary symptoms none . Contraception is OCP working well. No missed pills or periods. Stressed has job interview in am! No other concerns today. Planning to have gastric sleeve done this summer, completing 6 months preparation now!  ROS Pertinent to HPI.  O:Healthy female WDWN Affect: normal, orientation x 3  Exam: Skin: warm and dry Abdomen: soft, non tender, no masses  Inguinal Lymph nodes: no enlargement or tenderness Pelvic exam: External genital: normal female,no lesions BUS: negative Vagina: scant watery non odorous  discharge noted. Affirm taken Cervix: normal, non tender, no CMT Uterus: normal, non tender Adnexa:normal, non tender, no masses or fullness noted Anus: no  Hemorrhoid noted, but cracking and scaling skin below anal opening. Patient acknowledges this is the area or itching. No lesions or blisters noted. Rectal exam: normal    Wet Prep results: KOH,Saline + yeast   A:Normal pelvic exam Yeast dermatitis STD screening Contraception OCP   P:Discussed findings of yeast dermatitis and etiology. Discussed Aveeno or baking soda sitz bath or rinse for comfort. Avoid moist clothes or pads for extended period of time in that area. Change underwear if becomes moist. Apply ointment thinly to area and this should help with itching.Marland Kitchen. Rx Mycolog ointment see order with instructions. Questions addressed. Labs: STD panel, Hep C, GC/chlamydia, Affirm  Rv prn

## 2017-09-14 LAB — HEP, RPR, HIV PANEL
HIV SCREEN 4TH GENERATION: NONREACTIVE
Hepatitis B Surface Ag: NEGATIVE
RPR: NONREACTIVE

## 2017-09-14 LAB — CHLAMYDIA/GC NAA, CONFIRMATION
CHLAMYDIA TRACHOMATIS, NAA: NEGATIVE
Neisseria gonorrhoeae, NAA: NEGATIVE

## 2017-09-14 LAB — VAGINITIS/VAGINOSIS, DNA PROBE
Candida Species: NEGATIVE
GARDNERELLA VAGINALIS: NEGATIVE
TRICHOMONAS VAG: NEGATIVE

## 2017-09-14 LAB — HEPATITIS C ANTIBODY

## 2017-09-18 ENCOUNTER — Encounter: Payer: Self-pay | Admitting: Registered"

## 2017-09-18 ENCOUNTER — Encounter: Payer: 59 | Attending: Surgery | Admitting: Registered"

## 2017-09-18 DIAGNOSIS — Z713 Dietary counseling and surveillance: Secondary | ICD-10-CM | POA: Insufficient documentation

## 2017-09-18 DIAGNOSIS — E669 Obesity, unspecified: Secondary | ICD-10-CM

## 2017-09-18 NOTE — Patient Instructions (Addendum)
-   Aim to not skip meals. Focus on eating 3 meals/day.   - Continue to aiming for at least 64 ounces of fluid a day.   - Aim to make healthy food choices. Aim to plan a day a head of time for meals/snacks.

## 2017-09-18 NOTE — Progress Notes (Signed)
Sleeve gastrectomy Appt start time: 11:30 end time: 11:45  Assessment: 1st SWL Appointment.   Start Wt at NDES: 310.7 Wt: 311.6 BMI: 49.59   Pt arrives having maintained weight from previous visit.   Pt states she has tried to increase water intake. Pt states she chugs water twice a day because she does not think about drinking throughout her day; states increased water intake makes her feel good. Pt states she is still working on eating 3 meals a day. Pt reports rationalizing that if she eats breakfast then she does not need to eat lunch and vice versa. Pt states she likes routine and does not like to think about things. Pt states [place]    MEDICATIONS: See list   DIETARY INTAKE:  24-hr recall:  First Meal: sometimes skips; eggs, peanut butter toast Snack: none Second Meal: sometimes skips; Jimmy John's or  Ghassan's, or Sonic-cheeseburger Snack: none Third Meal: Vietnamese-beef, green beans Snack: cheesecake Beverages: diet coke, Coke, ginger ale, water, coffee  Usual physical activity: none stated  Diet to Follow: 2000 calories 225 g carbohydrates 150 g protein 56 g fat  Preferred Learning Style:   No preference indicated   Learning Readiness:   Ready  Change in progress     Nutritional Diagnosis:  [place]-3.3 Overweight/obesity related to past poor dietary habits and physical inactivity as evidenced by patient w/ planned sleeve gastrectomy surgery following dietary guidelines for continued weight loss.    Intervention:  Nutrition counseling for upcoming Bariatric Surgery.  Goals:  - Aim for 150 minutes of physical activity including cardio and weight bearing every week - Aim to not skip meals. Focus on eating 3 meals/day.  - Continue to aiming for at least 64 ounces of fluid a day.  - Aim to make healthy food choices. Aim to plan a day a head of time for meals/snacks.   Teaching Method Utilized:   Visual Auditory Hands on  Handouts given during visit include:  none  Barriers to learning/adherence to lifestyle change: work-life balance  Demonstrated degree of understanding via:  Teach Back   Monitoring/Evaluation:  Dietary intake, exercise, and body weight in 1 month(s).

## 2017-10-16 ENCOUNTER — Ambulatory Visit: Payer: Self-pay | Admitting: Registered"

## 2017-10-19 ENCOUNTER — Encounter: Payer: Self-pay | Admitting: Registered"

## 2017-10-19 ENCOUNTER — Encounter: Payer: 59 | Attending: Surgery | Admitting: Registered"

## 2017-10-19 DIAGNOSIS — Z713 Dietary counseling and surveillance: Secondary | ICD-10-CM | POA: Insufficient documentation

## 2017-10-19 DIAGNOSIS — E669 Obesity, unspecified: Secondary | ICD-10-CM

## 2017-10-19 NOTE — Progress Notes (Signed)
Sleeve gastrectomy Appt start time: 11:15 end time: 11:40  Assessment: 2nd SWL Appointment.   Start Wt at NDES: 310.7 Wt: 313.5 BMI: 49.84   Pt arrives having gained about 2 lbs from previous visit.   Pt states she is doing better with eating 3 meals a day, still skips meals at times but doing better. Pt states she is doing better with increasing water intake (averaging 24-32 ounces a day). Pt states she is in the process of creating a healthier life. 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    MEDICATIONS: See list   DIETARY INTAKE:  24-hr recall:  First Meal: sometimes skips; KIND bars or toast Snack: none Second Meal: Jimmy John's or  Ghassan's (salad), or Sonic-cheeseburger Snack: none Third Meal: Vietnamese-beef, green beans Snack: cheesecake Beverages: diet coke, Coke, ginger ale, water, coffee  Usual physical activity: none stated  Diet to Follow: 2000 calories 225 g carbohydrates 150 g protein 56 g fat  Preferred Learning Style:   No preference indicated   Learning Readiness:   Ready  Change in progress     Nutritional Diagnosis:  [place]-3.3 Overweight/obesity related to past poor dietary habits and physical inactivity as evidenced by patient w/ planned sleeve gastrectomy surgery following dietary guidelines for continued weight loss.    Intervention:  Nutrition counseling for upcoming Bariatric Surgery.  Goals:  - Aim for 150 minutes of physical activity including cardio and weight bearing every week - Continue to increase fluid intake aiming for goal of at least 64 ounces a day.  - Continue to focus on not skipping meals.  - Make healthy choices.  - Look into finding at least 2-3 protein shake options that you enjoy.   Teaching Method Utilized:  Visual Auditory Hands on  Handouts given during visit include:  none  Barriers to learning/adherence to lifestyle change: work-life balance  Demonstrated degree of  understanding via:  Teach Back   Monitoring/Evaluation:  Dietary intake, exercise, and body weight in 1 month(s).

## 2017-10-19 NOTE — Patient Instructions (Addendum)
-   Continue to increase fluid intake aiming for goal of at least 64 ounces a day.   - Continue to focus on not skipping meals.   - Make healthy choices.   - Look into finding at least 2-3 protein shake options that you enjoy.

## 2017-11-20 ENCOUNTER — Encounter: Payer: Self-pay | Admitting: Registered"

## 2017-11-20 ENCOUNTER — Encounter: Payer: 59 | Attending: Surgery | Admitting: Registered"

## 2017-11-20 ENCOUNTER — Telehealth: Payer: Self-pay | Admitting: Certified Nurse Midwife

## 2017-11-20 DIAGNOSIS — Z713 Dietary counseling and surveillance: Secondary | ICD-10-CM | POA: Insufficient documentation

## 2017-11-20 NOTE — Telephone Encounter (Signed)
Patient called requesting an appointment for today for a possible yeast infection. She is hoping for something late this afternoon, if possible.

## 2017-11-20 NOTE — Patient Instructions (Signed)
-   Continue to increase fluid intake aiming for goal of at least 64 ounces a day.   - Continue to focus on not skipping meals.   - Make healthy choices.

## 2017-11-20 NOTE — Telephone Encounter (Signed)
Spoke with patient. States she is as work, can not discuss symptoms in detail. Reports vaginal d/c with odor started on 4/20. Prefers not to use OTC monistat, " doesn't work". Patient requesting OV today.   Advised Leota Sauerseborah Leonard, CNM is out of the office, offered OV with covering provider, patient declined appt time. Scheduled for 4/23 at 8am with Leota Sauerseborah Leonard, CNM.   Routing to provider for final review. Patient is agreeable to disposition. Will close encounter.

## 2017-11-20 NOTE — Progress Notes (Signed)
Sleeve gastrectomy Appt start time: 2:09 end time: 2:28  Assessment: 3rd SWL Appointment.   Start Wt at NDES: 310.7 Wt: 313.5 BMI: 49.84    Pt arrives having gained about 2 lbs from previous visit.   Pt states she is under excessive stress due to work and preparing to move in June. Pt states she has very high expectations of herself and feels she does not manage stress well. Pt states she has not been sleeping well. Pt states she has been working with trying to find a new home for 8948 year old autistic boy and its hard for her to leave him. Pt states she likes Premier Protein (chocolate and cookies and cream flavors). Pt states she has been doing well with not skipping meals. Pt states her mom and friends are supportive. Pt states she wants to have another month to work on previously set goals.   Pt states she is doing better with increasing water intake (averaging 24-32 ounces a day). Pt states she is in the process of creating a healthier life. 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    MEDICATIONS: See list   DIETARY INTAKE:  24-hr recall:  First Meal: sometimes skips; KIND bars or toast Snack: none Second Meal: Jimmy John's or  Ghassan's (salad), or Sonic-cheeseburger Snack: none Third Meal: Vietnamese-beef, green beans Snack: cheesecake Beverages: diet coke, Coke, ginger ale, water, coffee  Usual physical activity: none stated  Diet to Follow: 2000 calories 225 g carbohydrates 150 g protein 56 g fat  Preferred Learning Style:   No preference indicated   Learning Readiness:   Ready  Change in progress     Nutritional Diagnosis:  [place]-3.3 Overweight/obesity related to past poor dietary habits and physical inactivity as evidenced by patient w/ planned sleeve gastrectomy surgery following dietary guidelines for continued weight loss.    Intervention:  Nutrition counseling for upcoming Bariatric Surgery.  Goals:  - Aim for 150 minutes of  physical activity including cardio and weight bearing every week - Continue to increase fluid intake aiming for goal of at least 64 ounces a day.  - Continue to focus on not skipping meals.  - Make healthy choices.   Teaching Method Utilized:  Visual Auditory Hands on  Handouts given during visit include:  none  Barriers to learning/adherence to lifestyle change: work-life balance  Demonstrated degree of understanding via:  Teach Back   Monitoring/Evaluation:  Dietary intake, exercise, and body weight in 1 month(s).

## 2017-11-21 ENCOUNTER — Other Ambulatory Visit: Payer: Self-pay

## 2017-11-21 ENCOUNTER — Encounter: Payer: Self-pay | Admitting: Certified Nurse Midwife

## 2017-11-21 ENCOUNTER — Ambulatory Visit: Payer: 59 | Admitting: Certified Nurse Midwife

## 2017-11-21 VITALS — BP 118/74 | HR 70 | Resp 16 | Ht 67.0 in | Wt 314.0 lb

## 2017-11-21 DIAGNOSIS — B373 Candidiasis of vulva and vagina: Secondary | ICD-10-CM

## 2017-11-21 DIAGNOSIS — Z113 Encounter for screening for infections with a predominantly sexual mode of transmission: Secondary | ICD-10-CM

## 2017-11-21 DIAGNOSIS — B3731 Acute candidiasis of vulva and vagina: Secondary | ICD-10-CM

## 2017-11-21 MED ORDER — FLUCONAZOLE 150 MG PO TABS: ORAL_TABLET | ORAL | 0 refills | Status: DC

## 2017-11-21 NOTE — Progress Notes (Signed)
32 y.o. Single Caucasian female G0P0000 here with complaint of vaginal symptoms of itching, burning with urination and increase weird odorous discharge. Vagina is sore. Has new partner with sexual activity of oral and hand stimulation, recently. No condom use .Onset of symptoms 3 days ago. Denies new personal products or vaginal dryness. Has STD concerns and would like testing. Urinary symptoms burning with urination, but only when touches skin. Denies urinary frequency or urgency. Contraception is OCP. Also has had a change in her psychiatric medications.  Now on Lamictal and feeling better so far. Has stopped Lexapro. 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 Planning gastric sleeve surgery in 7/19. No other health issues today.  ROS Pertinent to HPI.  O:Healthy female WDWN Affect: normal, orientation x 3  Exam:Skin: warm and dry Abdomen:soft, non tender, no masses  Inguinal Lymph nodes: no enlargement or tenderness Pelvic exam: External genital: normal female, no lesions, but exudate present with redness and slight cracking of skin, wet prep taken BUS: negative Bladder and urethral meatus  Non tender Vagina: thick white/pink odorous  discharge noted. Ph:4.0   ,Wet prep taken, Affirm taken Cervix: normal, non tender, no CMT Uterus: normal, non tender Adnexa:normal, non tender, no masses or fullness noted   Wet Prep results: KOH, Saline + yeast vaginal and skin   A:Normal pelvic exam Contraception OCP Yeast vulvitis/vaginitis R/O STD's vaginal only, declined serum screening Obesity Gastric sleeve program in place now   P:Discussed findings of yeast vaginitis/vulvitis and etiology. Discussed Aveeno or baking soda sitz bath for comfort. Avoid moist clothes  for extended period of time. Discussed if having hand or oral stimulation, make sure hands are clean and nails short to decrease trauma to skin and vaginal area. Questions addressed. Discussed  serum STD screening, prefers to wait until aex 7/19 unless positive testing from screening today. Patient has Mycolog ointment from previous infection. Instructions given to apply thinly on external vulva area bid x 5 days.  Rx: Diflucan see order with instructions Labs: Affirm, Gc/Chlamydia Encouraged to follow through with her weight loss plans.  Rv prn, aex

## 2017-11-21 NOTE — Patient Instructions (Signed)

## 2017-11-22 ENCOUNTER — Other Ambulatory Visit: Payer: Self-pay

## 2017-11-22 LAB — GC/CHLAMYDIA PROBE AMP
CHLAMYDIA, DNA PROBE: NEGATIVE
NEISSERIA GONORRHOEAE BY PCR: NEGATIVE

## 2017-11-22 LAB — VAGINITIS/VAGINOSIS, DNA PROBE
Candida Species: POSITIVE — AB
GARDNERELLA VAGINALIS: POSITIVE — AB
Trichomonas vaginosis: NEGATIVE

## 2017-11-22 MED ORDER — METRONIDAZOLE 0.75 % VA GEL: VAGINAL | 0 refills | Status: DC

## 2017-12-05 DIAGNOSIS — Z566 Other physical and mental strain related to work: Secondary | ICD-10-CM | POA: Diagnosis not present

## 2017-12-20 ENCOUNTER — Encounter: Payer: Self-pay | Admitting: Registered"

## 2017-12-20 ENCOUNTER — Encounter: Payer: 59 | Attending: Surgery | Admitting: Registered"

## 2017-12-20 DIAGNOSIS — Z713 Dietary counseling and surveillance: Secondary | ICD-10-CM | POA: Diagnosis not present

## 2017-12-20 DIAGNOSIS — E669 Obesity, unspecified: Secondary | ICD-10-CM

## 2017-12-20 NOTE — Progress Notes (Signed)
Sleeve gastrectomy Appt start time: 2:05 end time: 2:20  Assessment: 4th SWL Appointment.   Start Wt at NDES: 310.7 Wt: 317.9 BMI: 50.54   Pt arrives having gained about 4.4 lbs from previous visit.   Pt states she is in the process of interviewing and looking for a new job. Pt states she is moving in 3 weeks, will have access to a pool. Pt states she has been doing better with fluid intake and eating 3 meals a day. Pt states she is not drinking 64 ounces daily. Pt states she may want to have an additional (7th) SWL visit with [place] prior to surgery because she wants to make sure she is in a good place before. Pt states she wants to work on eliminating dark soda from her daily regimen because Coke is her weakness.   Pt states she has very high expectations of herself and feels she does not manage stress well. Pt states she has not been sleeping well. Pt states she likes Premier Protein (chocolate and cookies and cream flavors). Pt states she has been doing well with not skipping meals. Pt states her mom and friends are supportive. Pt states she wants to have another month to work on previously set goals.   Pt states she is doing better with increasing water intake (averaging 24-32 ounces a day). Pt states she is in the process of creating a healthier life. 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   Next visit: chewing  MEDICATIONS: See list   DIETARY INTAKE:  24-hr recall:  First Meal: sometimes skips; KIND bars or toast Snack: none Second Meal: Jimmy [employer]'s or  Gh[employer]'s (salad), or Sonic-cheeseburger Snack: none Third Meal: Vietnamese-beef, green beans Snack: cheesecake Beverages: diet coke, Coke, ginger ale, water, coffee  Usual physical activity: none stated  Diet to Follow: 2000 calories 225 g carbohydrates 150 g protein 56 g fat  Preferred Learning Style:   No preference indicated   Learning Readiness:   Ready  Change in  progress     Nutritional Diagnosis:  [place]-3.3 Overweight/obesity related to past poor dietary habits and physical inactivity as evidenced by patient w/ planned sleeve gastrectomy surgery following dietary guidelines for continued weight loss.    Intervention:  Nutrition counseling for upcoming Bariatric Surgery. Pt was counseled on ways to begin eliminating carbonation intake and how to create physical activity routine. Pt was educated on the importance of drinking at least 64 ounces of fluid a day prior to surgery.  Goals:  - Eliminate dark soda.  - Aim to consistently drink at least 64 ounces a day.  - Aim for physical activity with walking the hill in your neighborhood 10 min, 3 days/day.   - Continue with habits already established!  Teaching Method Utilized:  Visual Auditory Hands on  Handouts given during visit include:  none  Barriers to learning/adherence to lifestyle change: work-life balance  Demonstrated degree of understanding via:  Teach Back   Monitoring/Evaluation:  Dietary intake, exercise, and body weight in 1 month(s).

## 2017-12-20 NOTE — Patient Instructions (Addendum)
-   Eliminate dark soda.   - Aim to consistently drink at least 64 ounces a day.   - Aim for physical activity with walking the hill in your neighborhood 10 min, 3 days/day.    - Continue with habits already established!

## 2017-12-30 ENCOUNTER — Ambulatory Visit (INDEPENDENT_AMBULATORY_CARE_PROVIDER_SITE_OTHER): Payer: 59

## 2017-12-30 ENCOUNTER — Other Ambulatory Visit: Payer: Self-pay

## 2017-12-30 ENCOUNTER — Encounter: Payer: Self-pay | Admitting: Urgent Care

## 2017-12-30 ENCOUNTER — Ambulatory Visit: Payer: 59 | Admitting: Urgent Care

## 2017-12-30 VITALS — BP 110/68 | HR 96 | Temp 98.4°F | Resp 16 | Ht 67.0 in | Wt 317.4 lb

## 2017-12-30 DIAGNOSIS — S299XXA Unspecified injury of thorax, initial encounter: Secondary | ICD-10-CM | POA: Diagnosis not present

## 2017-12-30 DIAGNOSIS — R1030 Lower abdominal pain, unspecified: Secondary | ICD-10-CM | POA: Diagnosis not present

## 2017-12-30 DIAGNOSIS — R110 Nausea: Secondary | ICD-10-CM

## 2017-12-30 DIAGNOSIS — M542 Cervicalgia: Secondary | ICD-10-CM

## 2017-12-30 DIAGNOSIS — R0789 Other chest pain: Secondary | ICD-10-CM | POA: Diagnosis not present

## 2017-12-30 DIAGNOSIS — R58 Hemorrhage, not elsewhere classified: Secondary | ICD-10-CM | POA: Diagnosis not present

## 2017-12-30 MED ORDER — MELOXICAM 15 MG PO TABS: 15.0000 mg | ORAL_TABLET | Freq: Every day | ORAL | 0 refills | Status: DC

## 2017-12-30 MED ORDER — KETOROLAC TROMETHAMINE 60 MG/2ML IM SOLN
60.0000 mg | Freq: Once | INTRAMUSCULAR | Status: AC
Start: 2017-12-30 — End: 2017-12-30
  Administered 2017-12-30: 60 mg via INTRAMUSCULAR

## 2017-12-30 MED ORDER — CYCLOBENZAPRINE HCL 10 MG PO TABS: 10.0000 mg | ORAL_TABLET | Freq: Three times a day (TID) | ORAL | 0 refills | Status: DC | PRN

## 2017-12-30 MED ORDER — ONDANSETRON 8 MG PO TBDP: 8.0000 mg | ORAL_TABLET | Freq: Three times a day (TID) | ORAL | 0 refills | Status: DC | PRN
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
## 2018-01-02 DIAGNOSIS — S301XXD Contusion of abdominal wall, subsequent encounter: Secondary | ICD-10-CM | POA: Diagnosis not present

## 2018-01-02 DIAGNOSIS — K5901 Slow transit constipation: Secondary | ICD-10-CM | POA: Diagnosis not present

## 2018-01-02 DIAGNOSIS — M542 Cervicalgia: Secondary | ICD-10-CM | POA: Diagnosis not present

## 2018-01-24 ENCOUNTER — Encounter: Payer: 59 | Attending: Surgery | Admitting: Registered"

## 2018-01-24 ENCOUNTER — Encounter: Payer: Self-pay | Admitting: Registered"

## 2018-01-24 DIAGNOSIS — Z713 Dietary counseling and surveillance: Secondary | ICD-10-CM | POA: Diagnosis not present

## 2018-01-24 DIAGNOSIS — E669 Obesity, unspecified: Secondary | ICD-10-CM

## 2018-01-24 NOTE — Patient Instructions (Addendum)
-   Continue to work on increasing fluid intake to at least 64 ounces a day.   - Aim for physical activity with walking the hill in your neighborhood 10 min, 3 days/day.    - Aim to chew at least 30 times per bite or to applesauce consistency.

## 2018-01-24 NOTE — Progress Notes (Signed)
Sleeve gastrectomy Appt start time: 10:08 end time: 10:28  Assessment: 5th SWL Appointment.   Start Wt at NDES: 310.7 Wt: 316.6 BMI: 50.34   Pt arrives having maintained weight from previous visit.   Pt states she has a new job and feels the best she has felt in a while. Pt states she also just moved into a new living space. Pt states she is ready to make surgery her priority. Pt states she has eliminated dark soda intake, but replaced with Sprite when she feels the urge. Pt states she is eating 3 meals a day. Pt states she is not drinking 64 ounces daily.   Pt states she may want to have an additional (7th) SWL visit with us prior to surgery because she wants to make sure she is in a good place before. Pt states Coke is her weakness.   Pt states she has very high expectations of herself and feels she does not manage stress well. Pt states she has not been sleeping well. Pt states she likes Premier Protein (chocolate and cookies and cream flavors). Pt states her mom and friends are supportive.    Next visit: not drinking with meals  MEDICATIONS: See list   DIETARY INTAKE:  24-hr recall:  First Meal: granola bar, coffee Snack: none Second Meal: [employer]'s-chicken sandwich  Snack: none Third Meal: spaghetti, salad  Snack: none Beverages: Sprite, ginger ale, mountain dew, water, coffee  Usual physical activity: some walking and lifting boxes due to moving from living space to another  Diet to Follow: 2000 calories 225 g carbohydrates 150 g protein 56 g fat  Preferred Learning Style:   No preference indicated   Learning Readiness:   Ready  Change in progress     Nutritional Diagnosis:  [place]-3.3 Overweight/obesity related to past poor dietary habits and physical inactivity as evidenced by patient w/ planned sleeve gastrectomy surgery following dietary guidelines for continued weight loss.    Intervention:  Nutrition counseling for upcoming Bariatric Surgery. Pt was  counseled on the importance of chewing at least 30 times per bite. Pt's questions about bariatric surgery were answered related to weight regain after surgery and the importance of establishing and maintaining behavioral habits.  Goals:  - Continue to work on increasing fluid intake to at least 64 ounces a day.  - Aim for physical activity with walking the hill in your neighborhood 10 min, 3 days/day.   - Aim to chew at least 30 times per bite or to applesauce consistency.    Teaching Method Utilized:  Visual Auditory Hands on  Handouts given during visit include:  none  Barriers to learning/adherence to lifestyle change: work-life balance  Demonstrated degree of understanding via:  Teach Back   Monitoring/Evaluation:  Dietary intake, exercise, and body weight in 1 month(s).

## 2018-02-05 ENCOUNTER — Telehealth: Payer: Self-pay | Admitting: Certified Nurse Midwife

## 2018-02-05 NOTE — Telephone Encounter (Signed)
Spoke with patient. Reports white vaginal d/c, some odor. Started 3-4 days ago. Does not prefer to use OTC monistat, makes symptoms worse.   Denies any other GYN symptoms or pain.  OV scheduled for 02/06/18 at 1:15pm with Leota Sauerseborah Leonard, CNM.  Routing to provider for final review. Patient is agreeable to disposition. Will close encounter.

## 2018-02-05 NOTE — Telephone Encounter (Signed)
Patient called with concerns she may have a yeast infection. Pharmacy on file is correct, if needed.

## 2018-02-06 ENCOUNTER — Ambulatory Visit: Payer: 59 | Admitting: Certified Nurse Midwife

## 2018-02-06 ENCOUNTER — Encounter: Payer: Self-pay | Admitting: Certified Nurse Midwife

## 2018-02-06 ENCOUNTER — Other Ambulatory Visit: Payer: Self-pay

## 2018-02-06 VITALS — BP 112/68 | HR 82 | Resp 14 | Ht 67.0 in | Wt 317.0 lb

## 2018-02-06 DIAGNOSIS — N898 Other specified noninflammatory disorders of vagina: Secondary | ICD-10-CM

## 2018-02-06 NOTE — Progress Notes (Signed)
32 y.o. Single Caucasian female G0P0000 here with complaint of vaginal symptoms of internal itching and external itching in perineal area. Some scant discharge, slight odor, with discharge. Describes discharge as scant and started with period change. Will be having weight loss surgery with gastric sleeve in September. Has been preparing for this. 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 Denies new personal products. No STD concerns. Urinary symptoms none. . Contraception is OCP, working well. No other health issues today.  Review of Systems  Constitutional: Negative.   HENT: Negative.   Eyes: Negative.   Respiratory: Negative.   Cardiovascular: Negative.   Gastrointestinal: Negative.   Genitourinary:       Vulvar/vaginal itching   Musculoskeletal: Negative.   Skin: Negative.   Neurological: Negative.   Endo/Heme/Allergies: Negative.   Psychiatric/Behavioral: Negative.     O:Healthy female WDWN Affect: normal, orientation x 3  Exam: Normal female Abdomen: soft, non tender Inguinal Lymph nodes: no enlargement or tenderness Pelvic exam: External genital: normal female with slight redness around introitus BUS: negative Vagina: white discharge noted. No odor Affirm taken Cervix: normal, non tender, no CMT Uterus: normal, non tender Adnexa:normal, non tender, no masses or fullness noted   A:Normal pelvic exam Contraception  OCP Vaginal irritation, R/O vaginal infection    P:Discussed findings of white slight odorous discharge and normal pelvic exam.  Discussed Aveeno or baking soda sitz bath for comfort. Avoid moist clothes  for extended period of time. If working out in gym clothes or swim suits for long periods of time change underwear or bottoms of swimsuit if possible Lab: Affirm Will treat if indicated once Affirm in.  Rv prn,aex

## 2018-02-06 NOTE — Patient Instructions (Signed)

## 2018-02-07 ENCOUNTER — Other Ambulatory Visit: Payer: Self-pay

## 2018-02-07 LAB — VAGINITIS/VAGINOSIS, DNA PROBE
CANDIDA SPECIES: POSITIVE — AB
Gardnerella vaginalis: NEGATIVE
TRICHOMONAS VAG: NEGATIVE

## 2018-02-07 MED ORDER — FLUCONAZOLE 150 MG PO TABS: ORAL_TABLET | ORAL | 0 refills | Status: DC

## 2018-02-08 ENCOUNTER — Ambulatory Visit (INDEPENDENT_AMBULATORY_CARE_PROVIDER_SITE_OTHER): Payer: 59 | Admitting: Psychiatry

## 2018-02-08 DIAGNOSIS — F509 Eating disorder, unspecified: Secondary | ICD-10-CM | POA: Diagnosis not present

## 2018-02-15 ENCOUNTER — Ambulatory Visit: Payer: BLUE CROSS/BLUE SHIELD | Admitting: Certified Nurse Midwife

## 2018-02-20 ENCOUNTER — Encounter: Payer: Self-pay | Admitting: Registered"

## 2018-02-20 ENCOUNTER — Encounter: Payer: 59 | Attending: Surgery | Admitting: Registered"

## 2018-02-20 DIAGNOSIS — E669 Obesity, unspecified: Secondary | ICD-10-CM

## 2018-02-20 DIAGNOSIS — Z713 Dietary counseling and surveillance: Secondary | ICD-10-CM | POA: Insufficient documentation

## 2018-02-20 NOTE — Patient Instructions (Addendum)
-   Look into apartment complex gym for physical activity.   - Aim for physical activity with walking the hill in your neighborhood 10 min, 3 days/day.    - Aim to chew at least 30 times per bite or to applesauce consistency.    - Practice not drinking 15 minutes before eating, not while eating, and waiting 30 minutes after eating to drink.

## 2018-02-20 NOTE — Progress Notes (Signed)
Sleeve gastrectomy Appt start time: 11:28 end time: 11:42  Assessment: 6th SWL Appointment.   Start Wt at NDES: 310.7 Wt: 311.4 BMI: 49.51   Pt arrives having lost about 5.2 lbs from previous visit.   Pt states she has been increasing water intake and drinking less soda. Pt states she has not any Coke since previous visit. Pt states she is walking more by living on the 3rd floor and helping others move. Pt states she still needs to work on chewing. Pt states she is in a better place mentally with not feeling guilty about food choices and working a job that is less stressful.   Pt states she is ready to make surgery her priority. Pt states she is eating 3 meals a day. Pt states she is not drinking 64 ounces daily.   Pt states she may want to have an additional (7th) SWL visit with us prior to surgery because she wants to make sure she is in a good place before. Pt states Coke is her weakness.   Pt states she has very high expectations of herself and feels she does not manage stress well. Pt states she has not been sleeping well. Pt states she likes Premier Protein (chocolate and cookies and cream flavors). Pt states her mom and friends are supportive.    Next visit: not drinking with meals  MEDICATIONS: See list   DIETARY INTAKE:  24-hr recall:  First Meal: granola bar, coffee Snack: none Second Meal: Wendy's-chicken sandwich  Snack: none Third Meal: spaghetti, salad  Snack: none Beverages: Sprite, ginger ale, mountain dew, water, coffee  Usual physical activity: some walking and lifting boxes due to moving from living space to another  Diet to Follow: 2000 calories 225 g carbohydrates 150 g protein 56 g fat  Preferred Learning Style:   No preference indicated   Learning Readiness:   Ready  Change in progress     Nutritional Diagnosis:  -3.3 Overweight/obesity related to past poor dietary habits and physical inactivity as evidenced by patient w/ planned  sleeve gastrectomy surgery following dietary guidelines for continued weight loss.    Intervention:  Nutrition counseling for upcoming Bariatric Surgery. Pt was counseled on the importance of chewing at least 30 times per bite. Pt was encouraged to create a physical activity routine and educated and counseled on the benefits of not drinking with meals. Pt was in agreement with goals listed.  Goals:  - Look into apartment complex gym for physical activity.  - Aim for physical activity with walking the hill in your neighborhood 10 min, 3 days/day.   - Aim to chew at least 30 times per bite or to applesauce consistency.   - Practice not drinking 15 minutes before eating, not while eating, and waiting 30 minutes after eating to drink.    Teaching Method Utilized:  Visual Auditory Hands on  Handouts given during visit include:  none  Barriers to learning/adherence to lifestyle change: work-life balance  Demonstrated degree of understanding via:  Teach Back   Monitoring/Evaluation:  Dietary intake, exercise, and body weight in 1 month(s).

## 2018-02-28 ENCOUNTER — Ambulatory Visit (INDEPENDENT_AMBULATORY_CARE_PROVIDER_SITE_OTHER): Payer: Self-pay | Admitting: Psychiatry

## 2018-02-28 DIAGNOSIS — F509 Eating disorder, unspecified: Secondary | ICD-10-CM

## 2018-03-06 ENCOUNTER — Other Ambulatory Visit: Payer: Self-pay | Admitting: Certified Nurse Midwife

## 2018-03-06 NOTE — Telephone Encounter (Signed)
Medication refill request: blisovi  Last AEX:  02-09-17 DL  Next AEX: [id number]  Last MMG (if hormonal medication request): n/a Refill authorized: Today, please advise.   Spoke with patient. Patient states she does need refill on blisovi to get to aex. Patient requesting to change aex to week of august 26th. Aex rescheduled to 03/27/18 at 1315. Patient agreeable to date and time of appointment. Pharmacy confirmed.

## 2018-03-13 ENCOUNTER — Telehealth: Payer: Self-pay

## 2018-03-13 NOTE — Telephone Encounter (Signed)
patient is in 08 recall for abnormal pap and is due for follow up pap smear. Can you please call and schedule annual exam or appointment for follow up pap smear? Thank you.  Patient has aex scheduled for 03-27-18. Routing to tracy, triage nurse.

## 2018-03-14 NOTE — Telephone Encounter (Signed)
Recall extended to 03/31/18. Will close encounter.

## 2018-03-20 ENCOUNTER — Encounter: Payer: Self-pay | Admitting: Skilled Nursing Facility1

## 2018-03-20 ENCOUNTER — Encounter: Payer: 59 | Attending: Surgery | Admitting: Skilled Nursing Facility1

## 2018-03-20 DIAGNOSIS — Z713 Dietary counseling and surveillance: Secondary | ICD-10-CM | POA: Insufficient documentation

## 2018-03-20 NOTE — Progress Notes (Signed)
Sleeve gastrectomy Appt start time: 11:28 end time: 11:42  Assessment: 6th SWL Appointment.   Start Wt at [facility]: 310.7 Wt: 310 BMI: 49.37   Pt states her mom and friends are supportive.   Pt returns having maintained weight. Pt does have PCOS. Pt states she feels like she is ready for surgery and feels she has made life changes liking where she is living and new job and implementing different goals.  Areas that have improved: more water, eating breakfast now/eating 3 meals a day, eating from home, less soda, less guilt with food choices, less snacking, less often using food emotionally  Pt states she realized worrying about weight gets her nowhere.    Next visit: not drinking with meals  MEDICATIONS: See list   DIETARY INTAKE:  24-hr recall:  First Meal: granola bar or eggs with veggies or protein shake, coffee Snack: none Second Meal: [employer]-chicken sandwich or chicken salad and crackers with veggies  Snack: none Third Meal: spaghetti, salad or chciken and veggies and rice Snack: popcorn Beverages: Sprite, water, coffee  Usual physical activity: some walking and lifting boxes due to moving from living space to another  Diet to Follow: 2000 calories 225 g carbohydrates 150 g protein 56 g fat  Preferred Learning Style:   No preference indicated   Learning Readiness:   Ready  Change in progress     Nutritional Diagnosis:  [place]-3.3 Overweight/obesity related to past poor dietary habits and physical inactivity as evidenced by patient w/ planned sleeve gastrectomy surgery following dietary guidelines for continued weight loss.    Intervention:  Nutrition counseling for upcoming Bariatric Surgery. Pt was counseled on the importance of chewing at least 30 times per bite. Pt was encouraged to create a physical activity routine and educated and counseled on the benefits of not drinking with meals. Pt was in agreement with goals listed.  Goals:  - Look into apartment  complex gym for physical activity.  - Aim for physical activity with walking the hill in your neighborhood 10 min, 3 days/day.   - Aim to chew at least 30 times per bite or to applesauce consistency.   - Practice not drinking 15 minutes before eating, not while eating, and waiting 30 minutes after eating to drink.    Teaching Method Utilized:  Visual Auditory Hands on  Handouts given during visit include:  none  Barriers to learning/adherence to lifestyle change: work-life balance  Demonstrated degree of understanding via:  Teach Back   Monitoring/Evaluation:  Dietary intake, exercise, and body weight in 1 month(s).

## 2018-03-22 ENCOUNTER — Ambulatory Visit: Payer: 59 | Admitting: Certified Nurse Midwife

## 2018-03-23 ENCOUNTER — Ambulatory Visit: Payer: Self-pay | Admitting: Registered"

## 2018-03-27 ENCOUNTER — Ambulatory Visit: Payer: 59 | Admitting: Certified Nurse Midwife

## 2018-03-27 ENCOUNTER — Encounter: Payer: Self-pay | Admitting: Certified Nurse Midwife

## 2018-03-27 ENCOUNTER — Other Ambulatory Visit (HOSPITAL_COMMUNITY)
Admission: RE | Admit: 2018-03-27 | Discharge: 2018-03-27 | Disposition: A | Discharge status: Home / Self Care | Payer: 59 | Source: Ambulatory Visit | Attending: Certified Nurse Midwife | Admitting: Certified Nurse Midwife

## 2018-03-27 ENCOUNTER — Other Ambulatory Visit: Payer: Self-pay

## 2018-03-27 VITALS — BP 108/76 | HR 70 | Resp 16 | Ht 66.75 in | Wt 310.0 lb

## 2018-03-27 DIAGNOSIS — Z789 Other specified health status: Secondary | ICD-10-CM

## 2018-03-27 DIAGNOSIS — Z87898 Personal history of other specified conditions: Secondary | ICD-10-CM

## 2018-03-27 DIAGNOSIS — K1370 Unspecified lesions of oral mucosa: Secondary | ICD-10-CM | POA: Diagnosis not present

## 2018-03-27 DIAGNOSIS — Z8742 Personal history of other diseases of the female genital tract: Secondary | ICD-10-CM

## 2018-03-27 DIAGNOSIS — Z124 Encounter for screening for malignant neoplasm of cervix: Secondary | ICD-10-CM | POA: Insufficient documentation

## 2018-03-27 DIAGNOSIS — Z01419 Encounter for gynecological examination (general) (routine) without abnormal findings: Secondary | ICD-10-CM

## 2018-03-27 DIAGNOSIS — IMO0001 Reserved for inherently not codable concepts without codable children: Secondary | ICD-10-CM

## 2018-03-27 MED ORDER — NORETHIN ACE-ETH ESTRAD-FE 1-20 MG-MCG PO TABS: 1.0000 | ORAL_TABLET | Freq: Every day | ORAL | 4 refills | Status: DC

## 2018-03-27 NOTE — Progress Notes (Signed)
32 y.o. G0P0000 Single  Caucasian Fe here for annual exam. Periods normal. No issues with OCP  or warning signs noted. No partner change. No STD screening needed. Aware of need for repeat pap smear this year. Gastric sleeve surgery to be scheduled soon. Patient needs her refill on OCP to request Blisovi only due to rash with Junel. Still having stress with job but adjusting. Has noted oral lesion ? HPV, please check. Continues with MD management of Lamictal, Metformin. Uses counseling as needed. No other health issues today.  Patient's last menstrual period was 03/12/2018.          Sexually active: Yes.    The current method of family planning is OCP (estrogen/progesterone).    Exercising: No.  exercise Smoker:  no  Review of Systems  Constitutional: Negative.   HENT: Negative.   Eyes: Negative.   Respiratory: Negative.   Cardiovascular: Negative.   Gastrointestinal: Negative.   Genitourinary: Negative.   Musculoskeletal: Negative.   Skin: Negative.   Neurological: Negative.   Endo/Heme/Allergies: Negative.   Psychiatric/Behavioral: Negative.     Health Maintenance: Pap:  02-18-14 neg, 02-09-17 neg HPV HR+ 16, 18/45 neg History of Abnormal Pap: yes MMG:  none Self Breast exams: occ Colonoscopy:  none BMD:   none TDaP:  2015 Shingles: no Pneumonia: no Hep C and HIV: both neg 2019 Labs: no   reports that she has never smoked. She has never used smokeless tobacco. She reports that she drinks about 1.0 - 2.0 standard drinks of alcohol per week. She reports that she does not use drugs.  Past Medical History:  Diagnosis Date  . Allergic rhinitis    Allergy- shots with Dr Sharyn Lull  . Allergy   . Amenorrhea   . Anxiety    has a history of panic disorder  . Asthma   . Bipolar 2 disorder (HCC)   . Depression    treated with anxiety- current  counseling   . GERD (gastroesophageal reflux disease)   . Herniated disc    back  . History of bronchitis   . History of chicken pox    childhood  . History of headache   . History of migraines   . Hypertension   . Menorrhagia   . PCOS (polycystic ovarian syndrome)     Past Surgical History:  Procedure Laterality Date  . WISDOM TOOTH EXTRACTION  2003    Current Outpatient Medications  Medication Sig Dispense Refill  . BLISOVI FE 1/20 1-20 MG-MCG tablet TAKE 1 TABLET BY MOUTH EVERY DAY 28 tablet 0  . lamoTRIgine (LAMICTAL) 150 MG tablet TAKE 1 TABLET ONCE DAILY, DO NOT RESTART IF OUT FOR > 1 WEEK, CALL MD  1  . levocetirizine (XYZAL) 5 MG tablet Take 5 mg by mouth every evening.    . metFORMIN (GLUCOPHAGE-XR) 500 MG 24 hr tablet TK 1 T PO D  3  . omeprazole (PRILOSEC) 20 MG capsule Take by mouth daily.  99   No current facility-administered medications for this visit.     Family History  Problem Relation Age of Onset  . Alcohol abuse Mother   . Hyperlipidemia Mother   . Heart disease Mother   . Anxiety disorder Mother   . Hyperlipidemia Father   . Diabetes Father   . Anxiety disorder Sister   . Hyperlipidemia Maternal Grandmother   . Alzheimer's disease Maternal Grandmother   . Pancreatic cancer Paternal Grandfather   . Stroke Maternal Grandfather   . Alcohol abuse Maternal  Grandfather     ROS:  Pertinent items are noted in HPI.  Otherwise, a comprehensive ROS was negative.  Exam:   BP 108/76   Pulse 70   Resp 16   Ht 5' 6.75" (1.695 m)   Wt (!) 310 lb (140.6 kg)   LMP 03/12/2018   BMI 48.92 kg/m  Height: 5' 6.75" (169.5 cm) Ht Readings from Last 3 Encounters:  03/27/18 5' 6.75" (1.695 m)  03/20/18 5' 6.5" (1.689 m)  02/20/18 5' 6.5" (1.689 m)    General appearance: alert, cooperative and appears stated age Head: Normocephalic, without obvious abnormality, atraumatic Mouth: small pearlescent papule noted in roof of mouth, no warty appearance noted, no redness Neck: no adenopathy, supple, symmetrical, trachea midline and thyroid normal to inspection and palpation Lungs: clear to auscultation  bilaterally Breasts: normal appearance, no masses or tenderness, No nipple retraction or dimpling, No nipple discharge or bleeding, No axillary or supraclavicular adenopathy Heart: regular rate and rhythm Abdomen: soft, non-tender; no masses,  no organomegaly Extremities: extremities normal, atraumatic, no cyanosis or edema Skin: Skin color, texture, turgor normal. No rashes or lesions Lymph nodes: Cervical, supraclavicular, and axillary nodes normal. No abnormal inguinal nodes palpated Neurologic: Grossly normal   Pelvic: External genitalia:  no lesions, normal female              Urethra:  normal appearing urethra with no masses, tenderness or lesions              Bartholin's and Skene's: normal                 Vagina: normal appearing vagina with normal color and discharge, no lesions              Cervix: no cervical motion tenderness, no lesions and nulliparous appearance              Pap taken: Yes.   Bimanual Exam:  Uterus:  normal size, contour, position, consistency, mobility, non-tender and anteverted              Adnexa: normal adnexa and no mass, fullness, tenderness               Rectovaginal: Confirms               Anus:  normal sphincter tone, no lesions  Chaperone present: yes  A:  Well Woman with normal exam  Contraception OCP desired  Follow pap smear of negative pap+ HPV, negative 16,18,45  Weight loss for preparation for weight loss surgery( to be scheduled soon) Morbid obesity  Probable benign mouth lesion  P:   Reviewed health and wellness pertinent to exam  Risks/benefits/warning signs/side effects reviewed, desires continuance  Rx Blisovi Fe see order with instructions  Continue follow up with MD as indicated, wished well surgery   Discussed finding not characteristic of HPV, but recommend follow up with dentist.  Pap smear: yes, if negative repeat one year if  Not per results   counseled on breast self exam, STD prevention, HIV risk factors and prevention,  use and side effects of OCP's, adequate intake of calcium and vitamin D, diet and exercise  return annually or prn  An After Visit Summary was printed and given to the patient.

## 2018-03-27 NOTE — Patient Instructions (Signed)

## 2018-03-29 LAB — CYTOLOGY - PAP
DIAGNOSIS: NEGATIVE
HPV (WINDOPATH): DETECTED — AB
HPV 16/18/45 genotyping: NEGATIVE

## 2018-03-30 ENCOUNTER — Other Ambulatory Visit: Payer: Self-pay | Admitting: Certified Nurse Midwife

## 2018-03-30 DIAGNOSIS — Z124 Encounter for screening for malignant neoplasm of cervix: Secondary | ICD-10-CM

## 2018-04-06 ENCOUNTER — Telehealth: Payer: Self-pay | Admitting: *Deleted

## 2018-04-06 NOTE — Telephone Encounter (Signed)
Notes recorded by Leda Min, RN on 04/06/2018 at 2:31 PM EDT Left message to call Noreene Larsson at [phone].  ------  Notes recorded by Verner Chol, CNM on 04/06/2018 at 7:49 AM EDT Notify patient her pap was negative, but HPV detected. 24,82,50 not detected. No further evaluation at this time. Needs repeat pap smear in one year 08 If she did not take the HPV vaccine she may want to consider and check with her insurance regarding Gardasil immunization coverage.

## 2018-04-06 NOTE — Telephone Encounter (Signed)
Spoke with patient, advised as seen below per Leota Sauers, CNM. Patient will consider Gardasil vaccine, will return call to office if she desires to proceed. Next AEX 03/29/19.  Patient verbalizes understanding and is agreeable. Encounter closed.   08 recall previous placed.

## 2018-04-16 ENCOUNTER — Telehealth: Payer: Self-pay | Admitting: Emergency Medicine

## 2018-04-16 ENCOUNTER — Encounter: Payer: 59 | Attending: Surgery | Admitting: Skilled Nursing Facility1

## 2018-04-16 DIAGNOSIS — Z713 Dietary counseling and surveillance: Secondary | ICD-10-CM | POA: Diagnosis not present

## 2018-04-16 DIAGNOSIS — E669 Obesity, unspecified: Secondary | ICD-10-CM

## 2018-04-16 DIAGNOSIS — R87810 Cervical high risk human papillomavirus (HPV) DNA test positive: Secondary | ICD-10-CM

## 2018-04-16 NOTE — Telephone Encounter (Signed)
Call to patient and advised needs colposcopy this year due to two years of HPV on pap smear.   Pt agreeable and understands need for procedure.   LMP 04/11/18. On OCP.  Colposcopy description and  Pre-procedure instructions given. Colposcopy ordered for pre-cert.   Pt advised to call back with any questions or concerns related to procedure.   Encounter to provider and will close.

## 2018-04-17 ENCOUNTER — Encounter: Payer: Self-pay | Admitting: Skilled Nursing Facility1

## 2018-04-17 NOTE — Progress Notes (Signed)
Pre-Operative Nutrition Class:  Appt start time: 9211   End time:  1830.  Patient was seen on 04/16/2018 for Pre-Operative Bariatric Surgery Education at [facility].   Surgery date:  Surgery type: sleeve Start weight at [facility]: 310.7 Weight today: 312  Samples given per MNT protocol. Patient educated on appropriate usage: ProCare Multivitamin Lot # [id number] Exp: 05/2019  Bariatric Advantage Calcium  Lot # [id number] Exp: Dec 21, 2018  Unjury Protein Powder  Shake Lot # [id number] Exp: October 07, 2018  The following the learning objectives were met by the patient during this course:  Identify Pre-Op Dietary Goals and will begin 2 weeks pre-operatively  Identify appropriate sources of fluids and proteins   State protein recommendations and appropriate sources pre and post-operatively  Identify Post-Operative Dietary Goals and will follow for 2 weeks post-operatively  Identify appropriate multivitamin and calcium sources  Describe the need for physical activity post-operatively and will follow MD recommendations  State when to call healthcare provider regarding medication questions or post-operative complications  Handouts given during class include:  Pre-Op Bariatric Surgery Diet Handout  Protein Shake Handout  Post-Op Bariatric Surgery Nutrition Handout  BELT Program Information Flyer  Support Group Information Flyer  [facility] Bariatric Supplements Price List  [facility]: [facility] per MD.

## 2018-04-19 ENCOUNTER — Telehealth: Payer: Self-pay | Admitting: Certified Nurse Midwife

## 2018-04-19 NOTE — Telephone Encounter (Signed)
Patient returning Jill's call.  °

## 2018-04-19 NOTE — Telephone Encounter (Signed)
Left message to call Jessica Castro at [phone].  

## 2018-04-19 NOTE — Telephone Encounter (Signed)
Patient would like to reschedule her colposcopy appointment from 04/24/18.

## 2018-04-19 NOTE — Telephone Encounter (Signed)
Yes as long as not sexually active

## 2018-04-19 NOTE — Telephone Encounter (Signed)
Spoke with patient. Patient states she is having weight loss surgery on 10/14, went for her consult after scheduling colpo. Patient was advised to stop OCP, stopped OCP 04/15/18. LMP 04/11/18, menses started again on 9/19. Patient states she has not been SA since stopping OCP. Patient requesting to reschedule colpo due to menses.   Colpo rescheduled to 10/1 at 11am with Leota Sauerseborah Leonard, CNM. Advised to continue to abstain from intercourse, will review with Leota Sauerseborah Leonard, CNM and return call if any additional recommendations.   Leota Sauerseborah Leonard, CNM -please review, ok to proceed with colpo as scheduled?

## 2018-04-19 NOTE — Telephone Encounter (Signed)
Encounter closed

## 2018-04-24 ENCOUNTER — Ambulatory Visit: Payer: Self-pay | Admitting: Certified Nurse Midwife

## 2018-04-24 NOTE — Progress Notes (Signed)
Please place orders in Epic as patient has a pre-op appointment on 1010/2019! Thank you!

## 2018-04-25 NOTE — H&P (Signed)
Jessica Castro 07/05/2017 11:06 AM Location: [place] Office Patient #: [id number] DOB: 20-Apr-1986 Single / Language: [place] / Race: White Female   History of Present Illness Jessica Castro B. Jessica Deutscher Castro; 07/05/2017 12:01 PM) The patient is a 32 year old female who presents for a bariatric surgery evaluation. 66 year old WF (friend of Jessica Castro) who has had obesity most of her life and worse as an adult. Her primary is Jessica Castro. Her current weight is 316 lbs. She has GER and takes omeprazole. 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 She has been thinking about bariatric surgery for some time and is interested in sleeve gastrectomy. I explained sleeve and roux en y gastric bypass with her . She wants to have a sleeve and I explained about sleeve possibly worsening GER.   Today's weight is 312 lbs and she is 5' 7.5". --BMI 48.    UGI showed 4 cm sliding hiatal hernia.  Plan to repair   Past Surgical History Jessica Castro; 07/05/2017 11:06 AM) Oral Surgery   Diagnostic Studies History Jessica Castro; 07/05/2017 11:06 AM) Colonoscopy  never Mammogram  never Pap Smear  1-5 years ago  Allergies Jessica Castro; 07/05/2017 11:07 AM) No Known Drug Allergies 07/05/2017  Medication History (Jessica Castro, Castro; 07/05/2017 11:08 AM) Rona Ravens FE 1/20 (1-20MG -MCG Tablet, Oral) Active. Escitalopram Oxalate (10MG  Tablet, Oral) Active. MetFORMIN HCl (500MG  Tablet, Oral) Active. Omeprazole (20MG  Capsule DR, Oral) Active. Xyzal (2.5MG /5ML Solution, Oral) Active. Medications Reconciled  Social History Jessica Castro; 07/05/2017 11:06 AM) Alcohol use  Occasional alcohol use. Caffeine use  Carbonated beverages. No drug use  Tobacco use  Never smoker.  Family History Jessica Castro; 07/05/2017 11:06 AM) Diabetes Mellitus  Father. Migraine Headache  Mother.  Pregnancy / Birth History Jessica Castro; 07/05/2017 11:06 AM) Age at menarche  12 years. Contraceptive History  Oral contraceptives. Gravida  0 Irregular periods  Para  0  Other Problems Jessica Castro; 07/05/2017 11:06 AM) Anxiety Disorder  Back Pain  Depression  Gastroesophageal Reflux Disease  Hemorrhoids  High blood pressure  Hypercholesterolemia  Migraine Headache  Other disease, cancer, significant illness  Sleep Apnea     Review of Systems (Jessica Castro Castro; 07/05/2017 11:06 AM) General Present- Fatigue. Not Present- Appetite Loss, Chills, Fever, Night Sweats, Weight Gain and Weight Loss. HEENT Present- Seasonal Allergies and Wears glasses/contact lenses. Not Present- Earache, Hearing Loss, Hoarseness, Nose Bleed, Oral Ulcers, Ringing in the Ears, Sinus Pain, Sore Throat, Visual Disturbances and Yellow Eyes. Respiratory Present- Snoring. Not Present- Bloody sputum, Chronic Cough, Difficulty Breathing and Wheezing. Breast Not Present- Breast Mass, Breast Pain, Nipple Discharge and Skin Changes. Cardiovascular Present- Leg Cramps. Not Present- Chest Pain, Difficulty Breathing Lying Down, Palpitations, Rapid Heart Rate, Shortness of Breath and Swelling of Extremities. Gastrointestinal Present- Hemorrhoids and Indigestion. Not Present- Abdominal Pain, Bloating, Bloody Stool, Change in Bowel Habits, Chronic diarrhea, Constipation, Difficulty Swallowing, Excessive gas, Gets full quickly at meals, Nausea, Rectal Pain and Vomiting. Female Genitourinary Present- Frequency and Urgency. Not Present- Nocturia, Painful Urination and Pelvic Pain. Musculoskeletal Present- Back Pain, Joint Pain and Joint Stiffness. Not Present- Muscle Pain, Muscle Weakness and Swelling of Extremities. Neurological Present- Headaches. Not Present- Decreased Memory, Fainting, Numbness, Seizures, Tingling, Tremor, Trouble walking and Weakness. Psychiatric Present- Anxiety, Change in Sleep Pattern and Depression. Not Present-  Bipolar, Fearful and Frequent crying. Endocrine Present- Hot flashes. Not Present-  Cold Intolerance, Excessive Hunger, Hair Changes, Heat Intolerance and New Diabetes.  Vitals (Jessica Castro Castro; 07/05/2017 11:07 AM) 07/05/2017 11:06 AM Weight: 316.8 lb Height: 67.5in Body Surface Area: 2.47 m Body Mass Index: 48.89 kg/m  Pulse: 91 (Regular)  BP: 118/76 (Sitting, Left Arm, Standard)       Physical Exam (Jessica Castro; 07/05/2017 12:02 PM) The physical exam findings are as follows: Note:Obese WF NAD HEENT glasses Neck supple without masses Chest clear Heart SR without murmurs Abdomen nontender and obese Ext FROM Neuro (hx of migraines)    Assessment & Plan Jessica Castro(Jessica Castro; 07/05/2017 12:06 PM) MORBID OBESITY, UNSPECIFIED OBESITY TYPE (E66.01) Impression: Morbid obesity and a good candidate for sleeve gastrectomy.4 cm hiatal hernia  Plan : lap sleeve gastrectomy and hiatal hernia repair.   Jessica Castro Castro

## 2018-05-01 ENCOUNTER — Telehealth: Payer: Self-pay | Admitting: Certified Nurse Midwife

## 2018-05-01 ENCOUNTER — Ambulatory Visit: Payer: Self-pay | Admitting: Certified Nurse Midwife

## 2018-05-01 ENCOUNTER — Encounter: Payer: Self-pay | Admitting: Certified Nurse Midwife

## 2018-05-01 NOTE — Telephone Encounter (Signed)
Mailbox full, unable to leave message

## 2018-05-01 NOTE — Telephone Encounter (Signed)
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 Patient would like to reschedule. [facility] policy followed, to triage to reschedule.

## 2018-05-03 NOTE — Telephone Encounter (Signed)
Debbi, do you want charge dnka fee?

## 2018-05-03 NOTE — Telephone Encounter (Signed)
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

## 2018-05-04 NOTE — Telephone Encounter (Signed)
I don't think appropriate for this case

## 2018-05-07 NOTE — Patient Instructions (Addendum)
Jessica Castro  05/07/2018   Your procedure is scheduled on: 05-14-18   Report to [facility] Main  Entrance    Report to admitting at 1:00PM    Call this number if you have problems the morning of surgery [phone]     Remember: NO SOLID FOOD AFTER 6:00 PM THE NIGHT BEFORE YOUR SURGERY. YOU MAY DRINK CLEAR FLUIDS. MORNING OF SURGERY DRINK:  1 SHAKE BEFORE YOU LEAVE HOME, DRINK ALL OF THE SHAKE AT ONE TIME.  THE SHAKE YOU DRINK BEFORE YOU LEAVE HOME WILL BE THE LAST FLUIDS YOU DRINK BEFORE SURGERY. BRUSH YOUR TEETH MORNING OF SURGERY AND RINSE YOUR MOUTH OUT, NO CHEWING GUM CANDY OR MINTS.      CLEAR LIQUID DIET   Foods Allowed                                                                     Foods Excluded  Coffee and tea, regular and decaf                             liquids that you cannot  Plain Jell-O in any flavor                                             see through such as: Fruit ices (not with fruit pulp)                                     milk, soups, orange juice  Iced Popsicles                                    All solid food Carbonated beverages, regular and diet                                    Cranberry, grape and apple juices Sports drinks like Gatorade Lightly seasoned clear broth or consume(fat free) Sugar, honey syrup  Sample Menu Breakfast                                Lunch                                     Supper Cranberry juice                    Beef broth                            Chicken broth Jell-O  Grape juice                           Apple juice Coffee or tea                        Jell-O                                      Popsicle                                                Coffee or tea                        Coffee or tea  _____________________________________________________________________         Take these medicines the morning of surgery with A SIP OF WATER:  propanolol if needed                               You may not have any metal on your body including hair pins and              piercings  Do not wear jewelry, make-up, lotions, powders or perfumes, deodorant             Do not wear nail polish.  Do not shave  48 hours prior to surgery.          Do not bring valuables to the hospital. [facility] IS NOT             RESPONSIBLE   FOR VALUABLES.  Contacts, dentures or bridgework may not be worn into surgery.  Leave suitcase in the car. After surgery it may be brought to your room.                  Please read over the following fact sheets you were given: _____________________________________________________________________   PAIN IS EXPECTED AFTER SURGERY AND WILL NOT BE COMPLETELY ELIMINATED. AMBULATION AND TYLENOL WILL HELP REDUCE INCISIONAL AND GAS PAIN. MOVEMENT IS KEY!  YOU ARE EXPECTED TO BE OUT OF BED WITHIN 4 HOURS OF ADMISSION TO YOUR PATIENT ROOM.  SITTING IN THE RECLINER THROUGHOUT THE DAY IS IMPORTANT FOR DRINKING FLUIDS AND MOVING GAS THROUGHOUT THE GI TRACT.  COMPRESSION STOCKINGS SHOULD BE WORN [facility] STAY UNLESS YOU ARE WALKING.   INCENTIVE SPIROMETER SHOULD BE USED EVERY HOUR WHILE AWAKE TO DECREASE POST-OPERATIVE COMPLICATIONS SUCH AS PNEUMONIA.  WHEN DISCHARGED HOME, IT IS IMPORTANT TO CONTINUE TO WALK EVERY HOUR AND USE THE INCENTIVE SPIROMETER EVERY HOUR.               [place] - Preparing for Surgery Before surgery, you can play an important role.  Because skin is not sterile, your skin needs to be as free of germs as possible.  You can reduce the number of germs on your skin by washing with CHG (chlorahexidine gluconate) soap before surgery.  CHG is an antiseptic cleaner which kills germs and bonds with the skin to continue killing germs even after washing. Please DO NOT use if you have an allergy to CHG or antibacterial soaps.  If your skin becomes reddened/irritated stop using the CHG  and inform your nurse when you arrive at Short Stay. Do not shave (including legs and underarms) for at least 48 hours prior to the first CHG shower.  You may shave your face/neck. Please follow these instructions carefully:  1.  Shower with CHG Soap the night before surgery and the  morning of Surgery.  2.  If you choose to wash your hair, wash your hair first as usual with your  normal  shampoo.  3.  After you shampoo, rinse your hair and body thoroughly to remove the  shampoo.                           4.  Use CHG as you would any other liquid soap.  You can apply chg directly  to the skin and wash                       Gently with a scrungie or clean washcloth.  5.  Apply the CHG Soap to your body ONLY FROM THE NECK DOWN.   Do not use on face/ open                           Wound or open sores. Avoid contact with eyes, ears mouth and genitals (private parts).                       Wash face,  Genitals (private parts) with your normal soap.             6.  Wash thoroughly, paying special attention to the area where your surgery  will be performed.  7.  Thoroughly rinse your body with warm water from the neck down.  8.  DO NOT shower/wash with your normal soap after using and rinsing off  the CHG Soap.                9.  Pat yourself dry with a clean towel.            10.  Wear clean pajamas.            11.  Place clean sheets on your bed the night of your first shower and do not  sleep with pets. Day of Surgery : Do not apply any lotions/deodorants the morning of surgery.  Please wear clean clothes to the hospital/surgery center.  FAILURE TO FOLLOW THESE INSTRUCTIONS MAY RESULT IN THE CANCELLATION OF YOUR SURGERY PATIENT SIGNATURE_________________________________  NURSE SIGNATURE__________________________________  ________________________________________________________________________   Jessica Castro  An incentive spirometer is a tool that can help keep your lungs clear and  active. This tool measures how well you are filling your lungs with each breath. Taking long deep breaths may help reverse or decrease the chance of developing breathing (pulmonary) problems (especially infection) following:  A long period of time when you are unable to move or be active. BEFORE THE PROCEDURE   If the spirometer includes an indicator to show your best effort, your nurse or respiratory therapist will set it to a desired goal.  If possible, sit up straight or lean slightly forward. Try not to slouch.  Hold the incentive spirometer in an upright position. INSTRUCTIONS FOR USE  1. Sit on the edge of your bed if possible, or sit up as far as you can in bed or on  a chair. 2. Hold the incentive spirometer in an upright position. 3. Breathe out normally. 4. Place the mouthpiece in your mouth and seal your lips tightly around it. 5. Breathe in slowly and as deeply as possible, raising the piston or the ball toward the top of the column. 6. Hold your breath for 3-5 seconds or for as long as possible. Allow the piston or ball to fall to the bottom of the column. 7. Remove the mouthpiece from your mouth and breathe out normally. 8. Rest for a few seconds and repeat Steps 1 through 7 at least 10 times every 1-2 hours when you are awake. Take your time and take a few normal breaths between deep breaths. 9. The spirometer may include an indicator to show your best effort. Use the indicator as a goal to work toward during each repetition. 10. After each set of 10 deep breaths, practice coughing to be sure your lungs are clear. If you have an incision (the cut made at the time of surgery), support your incision when coughing by placing a pillow or rolled up towels firmly against it. Once you are able to get out of bed, walk around indoors and cough well. You may stop using the incentive spirometer when instructed by your caregiver.  RISKS AND COMPLICATIONS  Take your time so you do not get  dizzy or light-headed.  If you are in pain, you may need to take or ask for pain medication before doing incentive spirometry. It is harder to take a deep breath if you are having pain. AFTER USE  Rest and breathe slowly and easily.  It can be helpful to keep track of a log of your progress. Your caregiver can provide you with a simple table to help with this. If you are using the spirometer at home, follow these instructions: SEEK MEDICAL CARE IF:   You are having difficultly using the spirometer.  You have trouble using the spirometer as often as instructed.  Your pain medication is not giving enough relief while using the spirometer.  You develop fever of 100.5 F (38.1 C) or higher. SEEK IMMEDIATE MEDICAL CARE IF:   You cough up bloody sputum that had not been present before.  You develop fever of 102 F (38.9 C) or greater.  You develop worsening pain at or near the incision site. MAKE SURE YOU:   Understand these instructions.  Will watch your condition.  Will get help right away if you are not doing well or get worse. Document Released: 11/28/2006 Document Revised: 10/10/2011 Document Reviewed: 01/29/2007 [facility] Patient Information 2014 [place], [place].   ________________________________________________________________________

## 2018-05-07 NOTE — Progress Notes (Signed)
EKG 07-31-17 epic   CXr 12-30-17 epic

## 2018-05-07 NOTE — Telephone Encounter (Signed)
Left message to call Donalda Job, RN at [facility] [phone].   

## 2018-05-10 ENCOUNTER — Encounter (HOSPITAL_COMMUNITY): Payer: Self-pay

## 2018-05-10 ENCOUNTER — Other Ambulatory Visit: Payer: Self-pay

## 2018-05-10 ENCOUNTER — Encounter (HOSPITAL_COMMUNITY)
Admission: RE | Admit: 2018-05-10 | Discharge: 2018-05-10 | Disposition: A | Discharge status: Home / Self Care | Payer: 59 | Source: Ambulatory Visit | Attending: Surgery | Admitting: Surgery

## 2018-05-10 DIAGNOSIS — Z01812 Encounter for preprocedural laboratory examination: Secondary | ICD-10-CM | POA: Diagnosis not present

## 2018-05-10 LAB — COMPREHENSIVE METABOLIC PANEL
ALBUMIN: 4.4 g/dL (ref 3.5–5.0)
ALK PHOS: 92 U/L (ref 38–126)
ALT: 32 U/L (ref 0–44)
ANION GAP: 11 (ref 5–15)
AST: 26 U/L (ref 15–41)
BILIRUBIN TOTAL: 0.4 mg/dL (ref 0.3–1.2)
BUN: 12 mg/dL (ref 6–20)
CALCIUM: 9.3 mg/dL (ref 8.9–10.3)
CO2: 26 mmol/L (ref 22–32)
CREATININE: 0.82 mg/dL (ref 0.44–1.00)
Chloride: 105 mmol/L (ref 98–111)
GFR calc Af Amer: >60 mL/min (ref >60)
GFR calc non Af Amer: >60 mL/min (ref >60)
GLUCOSE: 91 mg/dL (ref 70–99)
Potassium: 4 mmol/L (ref 3.5–5.1)
SODIUM: 142 mmol/L (ref 135–145)
TOTAL PROTEIN: 7.1 g/dL (ref 6.5–8.1)

## 2018-05-10 LAB — CBC WITH DIFFERENTIAL/PLATELET
ABS IMMATURE GRANULOCYTES: 0.02 10*3/uL (ref 0.00–0.07)
BASOS ABS: 0 10*3/uL (ref 0.0–0.1)
Basophils Relative: 0 %
EOS PCT: 1 %
Eosinophils Absolute: 0.1 10*3/uL (ref 0.0–0.5)
HEMATOCRIT: 38.8 % (ref 36.0–46.0)
HEMOGLOBIN: 12.3 g/dL (ref 12.0–15.0)
Immature Granulocytes: 0 %
LYMPHS ABS: 2 10*3/uL (ref 0.7–4.0)
Lymphocytes Relative: 29 %
MCH: 26.5 pg (ref 26.0–34.0)
MCHC: 31.7 g/dL (ref 30.0–36.0)
MCV: 83.4 fL (ref 80.0–100.0)
MONO ABS: 0.5 10*3/uL (ref 0.1–1.0)
Monocytes Relative: 7 %
NEUTROS ABS: 4.3 10*3/uL (ref 1.7–7.7)
Neutrophils Relative %: 63 %
Platelets: 240 10*3/uL (ref 150–400)
RBC: 4.65 MIL/uL (ref 3.87–5.11)
RDW: 13.3 % (ref 11.5–15.5)
WBC: 6.9 10*3/uL (ref 4.0–10.5)
nRBC: 0 % (ref 0.0–0.2)

## 2018-05-10 LAB — ABO/RH: ABO/RH(D): O POS

## 2018-05-10 NOTE — Telephone Encounter (Signed)
Left message to call Stephani Janak, RN at [facility] [phone].   

## 2018-05-13 MED ORDER — BUPIVACAINE LIPOSOME 1.3 % IJ SUSP
20.0000 mL | Freq: Once | INTRAMUSCULAR | Status: DC
  Filled 2018-05-13: qty 20

## 2018-05-14 ENCOUNTER — Other Ambulatory Visit: Payer: Self-pay

## 2018-05-14 ENCOUNTER — Encounter (HOSPITAL_COMMUNITY): Payer: Self-pay | Admitting: *Deleted

## 2018-05-14 ENCOUNTER — Inpatient Hospital Stay (HOSPITAL_COMMUNITY)
Admission: RE | Admit: 2018-05-14 | Discharge: 2018-05-16 | DRG: 621 | Disposition: A | Discharge status: Home / Self Care | Payer: 59 | Source: Ambulatory Visit | Attending: Surgery | Admitting: Surgery

## 2018-05-14 ENCOUNTER — Inpatient Hospital Stay (HOSPITAL_COMMUNITY): Payer: 59 | Admitting: Certified Registered Nurse Anesthetist

## 2018-05-14 ENCOUNTER — Encounter (HOSPITAL_COMMUNITY)
Admission: RE | Disposition: A | Discharge status: Home / Self Care | Payer: Self-pay | Source: Ambulatory Visit | Attending: Surgery

## 2018-05-14 DIAGNOSIS — G4730 Sleep apnea, unspecified: Secondary | ICD-10-CM | POA: Diagnosis present

## 2018-05-14 DIAGNOSIS — Z818 Family history of other mental and behavioral disorders: Secondary | ICD-10-CM | POA: Diagnosis not present

## 2018-05-14 DIAGNOSIS — Z79890 Hormone replacement therapy (postmenopausal): Secondary | ICD-10-CM | POA: Diagnosis not present

## 2018-05-14 DIAGNOSIS — K449 Diaphragmatic hernia without obstruction or gangrene: Secondary | ICD-10-CM | POA: Diagnosis present

## 2018-05-14 DIAGNOSIS — F419 Anxiety disorder, unspecified: Secondary | ICD-10-CM | POA: Diagnosis present

## 2018-05-14 DIAGNOSIS — Z9884 Bariatric surgery status: Secondary | ICD-10-CM

## 2018-05-14 DIAGNOSIS — Z6841 Body Mass Index (BMI) 40.0 and over, adult: Secondary | ICD-10-CM

## 2018-05-14 DIAGNOSIS — E282 Polycystic ovarian syndrome: Secondary | ICD-10-CM | POA: Diagnosis present

## 2018-05-14 DIAGNOSIS — J309 Allergic rhinitis, unspecified: Secondary | ICD-10-CM | POA: Diagnosis present

## 2018-05-14 DIAGNOSIS — K219 Gastro-esophageal reflux disease without esophagitis: Secondary | ICD-10-CM | POA: Diagnosis not present

## 2018-05-14 DIAGNOSIS — Z79899 Other long term (current) drug therapy: Secondary | ICD-10-CM | POA: Diagnosis not present

## 2018-05-14 DIAGNOSIS — I10 Essential (primary) hypertension: Secondary | ICD-10-CM | POA: Diagnosis present

## 2018-05-14 HISTORY — PX: LAPAROSCOPIC GASTRIC SLEEVE RESECTION: SHX5895

## 2018-05-14 LAB — CREATININE, SERUM
Creatinine, Ser: 0.97 mg/dL (ref 0.44–1.00)
GFR calc non Af Amer: >60 mL/min (ref >60)

## 2018-05-14 LAB — CBC
HEMATOCRIT: 38.3 % (ref 36.0–46.0)
Hemoglobin: 11.9 g/dL — ABNORMAL LOW (ref 12.0–15.0)
MCH: 26 pg (ref 26.0–34.0)
MCHC: 31.1 g/dL (ref 30.0–36.0)
MCV: 83.6 fL (ref 80.0–100.0)
NRBC: 0 % (ref 0.0–0.2)
PLATELETS: 248 10*3/uL (ref 150–400)
RBC: 4.58 MIL/uL (ref 3.87–5.11)
RDW: 13.8 % (ref 11.5–15.5)
WBC: 12.7 10*3/uL — ABNORMAL HIGH (ref 4.0–10.5)

## 2018-05-14 LAB — TYPE AND SCREEN
ABO/RH(D): O POS
Antibody Screen: NEGATIVE

## 2018-05-14 LAB — PREGNANCY, URINE: Preg Test, Ur: NEGATIVE

## 2018-05-14 SURGERY — GASTRECTOMY, SLEEVE, LAPAROSCOPIC
Anesthesia: General

## 2018-05-14 MED ORDER — BUPIVACAINE LIPOSOME 1.3 % IJ SUSP
INTRAMUSCULAR | Status: DC | PRN
  Administered 2018-05-14: 20 mL

## 2018-05-14 MED ORDER — LACTATED RINGERS IR SOLN
Status: DC | PRN
  Administered 2018-05-14: 1000 mL

## 2018-05-14 MED ORDER — PHENYLEPHRINE 40 MCG/ML (10ML) SYRINGE FOR IV PUSH (FOR BLOOD PRESSURE SUPPORT)
PREFILLED_SYRINGE | INTRAVENOUS | Status: DC | PRN
  Administered 2018-05-14: 80 ug via INTRAVENOUS

## 2018-05-14 MED ORDER — LIDOCAINE HCL 2 % IJ SOLN
INTRAMUSCULAR | Status: AC
  Filled 2018-05-14: qty 20

## 2018-05-14 MED ORDER — ROCURONIUM BROMIDE 10 MG/ML (PF) SYRINGE
PREFILLED_SYRINGE | INTRAVENOUS | Status: DC | PRN
  Administered 2018-05-14: 20 mg via INTRAVENOUS
  Administered 2018-05-14: 70 mg via INTRAVENOUS

## 2018-05-14 MED ORDER — ACETAMINOPHEN 500 MG PO TABS
1000.0000 mg | ORAL_TABLET | ORAL | Status: AC
  Administered 2018-05-14: 1000 mg via ORAL
  Filled 2018-05-14: qty 2

## 2018-05-14 MED ORDER — HEPARIN SODIUM (PORCINE) 5000 UNIT/ML IJ SOLN
5000.0000 [IU] | INTRAMUSCULAR | Status: AC
  Administered 2018-05-14: 5000 [IU] via SUBCUTANEOUS
  Filled 2018-05-14: qty 1

## 2018-05-14 MED ORDER — SUGAMMADEX SODIUM 200 MG/2ML IV SOLN
INTRAVENOUS | Status: DC | PRN
  Administered 2018-05-14: 500 mg via INTRAVENOUS

## 2018-05-14 MED ORDER — FENTANYL CITRATE (PF) 100 MCG/2ML IJ SOLN
INTRAMUSCULAR | Status: DC | PRN
  Administered 2018-05-14 (×2): 100 ug via INTRAVENOUS
  Administered 2018-05-14: 50 ug via INTRAVENOUS

## 2018-05-14 MED ORDER — HYDRALAZINE HCL 20 MG/ML IJ SOLN: 10.0000 mg | INTRAMUSCULAR | Status: DC | PRN

## 2018-05-14 MED ORDER — CHLORHEXIDINE GLUCONATE CLOTH 2 % EX PADS: 6.0000 | MEDICATED_PAD | Freq: Once | CUTANEOUS | Status: DC

## 2018-05-14 MED ORDER — SODIUM CHLORIDE 0.9 % IV SOLN
2.0000 g | INTRAVENOUS | Status: AC
  Administered 2018-05-14: 2 g via INTRAVENOUS
  Filled 2018-05-14: qty 2

## 2018-05-14 MED ORDER — SCOPOLAMINE 1 MG/3DAYS TD PT72
1.0000 | MEDICATED_PATCH | TRANSDERMAL | Status: DC
  Administered 2018-05-14: 1.5 mg via TRANSDERMAL
  Filled 2018-05-14: qty 1

## 2018-05-14 MED ORDER — PROMETHAZINE HCL 25 MG/ML IJ SOLN
6.2500 mg | INTRAMUSCULAR | Status: AC | PRN
  Administered 2018-05-14: 6.25 mg via INTRAVENOUS
  Administered 2018-05-14: 12.5 mg via INTRAVENOUS

## 2018-05-14 MED ORDER — ONDANSETRON HCL 4 MG/2ML IJ SOLN
INTRAMUSCULAR | Status: DC | PRN
  Administered 2018-05-14: 4 mg via INTRAVENOUS

## 2018-05-14 MED ORDER — SODIUM CHLORIDE 0.9 % IJ SOLN
INTRAMUSCULAR | Status: DC | PRN
  Administered 2018-05-14: 10 mL

## 2018-05-14 MED ORDER — KCL IN DEXTROSE-NACL 20-5-0.45 MEQ/L-%-% IV SOLN
INTRAVENOUS | Status: DC
  Administered 2018-05-14 – 2018-05-16 (×4): via INTRAVENOUS
  Filled 2018-05-14 (×5): qty 1000

## 2018-05-14 MED ORDER — DEXAMETHASONE SODIUM PHOSPHATE 10 MG/ML IJ SOLN
INTRAMUSCULAR | Status: AC
  Filled 2018-05-14: qty 1

## 2018-05-14 MED ORDER — APREPITANT 40 MG PO CAPS
40.0000 mg | ORAL_CAPSULE | ORAL | Status: AC
  Administered 2018-05-14: 40 mg via ORAL
  Filled 2018-05-14: qty 1

## 2018-05-14 MED ORDER — SUCCINYLCHOLINE CHLORIDE 200 MG/10ML IV SOSY
PREFILLED_SYRINGE | INTRAVENOUS | Status: AC
  Filled 2018-05-14: qty 10

## 2018-05-14 MED ORDER — ONDANSETRON HCL 4 MG/2ML IJ SOLN
4.0000 mg | INTRAMUSCULAR | Status: DC | PRN
  Administered 2018-05-14 – 2018-05-15 (×4): 4 mg via INTRAVENOUS
  Filled 2018-05-14 (×4): qty 2

## 2018-05-14 MED ORDER — MIDAZOLAM HCL 2 MG/2ML IJ SOLN
INTRAMUSCULAR | Status: AC
  Filled 2018-05-14: qty 2

## 2018-05-14 MED ORDER — MORPHINE SULFATE (PF) 4 MG/ML IV SOLN
1.0000 mg | INTRAVENOUS | Status: DC | PRN
  Administered 2018-05-14 (×2): 1 mg via INTRAVENOUS
  Filled 2018-05-14 (×2): qty 1

## 2018-05-14 MED ORDER — FENTANYL CITRATE (PF) 250 MCG/5ML IJ SOLN
INTRAMUSCULAR | Status: AC
  Filled 2018-05-14: qty 5

## 2018-05-14 MED ORDER — MIDAZOLAM HCL 5 MG/5ML IJ SOLN
INTRAMUSCULAR | Status: DC | PRN
  Administered 2018-05-14: 2 mg via INTRAVENOUS

## 2018-05-14 MED ORDER — PROPOFOL 10 MG/ML IV BOLUS
INTRAVENOUS | Status: AC
  Filled 2018-05-14: qty 20

## 2018-05-14 MED ORDER — LIDOCAINE 2% (20 MG/ML) 5 ML SYRINGE
INTRAMUSCULAR | Status: AC
  Filled 2018-05-14: qty 5

## 2018-05-14 MED ORDER — EPHEDRINE 5 MG/ML INJ
INTRAVENOUS | Status: AC
  Filled 2018-05-14: qty 10

## 2018-05-14 MED ORDER — PHENYLEPHRINE 40 MCG/ML (10ML) SYRINGE FOR IV PUSH (FOR BLOOD PRESSURE SUPPORT)
PREFILLED_SYRINGE | INTRAVENOUS | Status: AC
  Filled 2018-05-14: qty 10

## 2018-05-14 MED ORDER — DEXAMETHASONE SODIUM PHOSPHATE 4 MG/ML IJ SOLN
INTRAMUSCULAR | Status: DC | PRN
  Administered 2018-05-14: 10 mg via INTRAVENOUS

## 2018-05-14 MED ORDER — SUGAMMADEX SODIUM 500 MG/5ML IV SOLN
INTRAVENOUS | Status: AC
  Filled 2018-05-14: qty 5

## 2018-05-14 MED ORDER — HEPARIN SODIUM (PORCINE) 5000 UNIT/ML IJ SOLN
5000.0000 [IU] | Freq: Three times a day (TID) | INTRAMUSCULAR | Status: DC
  Administered 2018-05-14 – 2018-05-16 (×5): 5000 [IU] via SUBCUTANEOUS
  Filled 2018-05-14 (×5): qty 1

## 2018-05-14 MED ORDER — HYDROMORPHONE HCL 1 MG/ML IJ SOLN: 0.2500 mg | INTRAMUSCULAR | Status: DC | PRN

## 2018-05-14 MED ORDER — ROCURONIUM BROMIDE 100 MG/10ML IV SOLN
INTRAVENOUS | Status: AC
  Filled 2018-05-14: qty 1

## 2018-05-14 MED ORDER — LIDOCAINE 20MG/ML (2%) 15 ML SYRINGE OPTIME
INTRAMUSCULAR | Status: DC | PRN
  Administered 2018-05-14: 1.5 mg/kg/h via INTRAVENOUS

## 2018-05-14 MED ORDER — PROMETHAZINE HCL 25 MG/ML IJ SOLN
INTRAMUSCULAR | Status: AC
  Administered 2018-05-14: 12.5 mg via INTRAVENOUS
  Filled 2018-05-14: qty 1

## 2018-05-14 MED ORDER — PREMIER PROTEIN SHAKE
2.0000 [oz_av] | ORAL | Status: DC
  Administered 2018-05-15 – 2018-05-16 (×5): 2 [oz_av] via ORAL

## 2018-05-14 MED ORDER — LIDOCAINE 2% (20 MG/ML) 5 ML SYRINGE
INTRAMUSCULAR | Status: DC | PRN
  Administered 2018-05-14: 60 mg via INTRAVENOUS

## 2018-05-14 MED ORDER — LACTATED RINGERS IV SOLN
INTRAVENOUS | Status: DC
  Administered 2018-05-14 (×2): via INTRAVENOUS

## 2018-05-14 MED ORDER — FAMOTIDINE IN NACL 20-0.9 MG/50ML-% IV SOLN
20.0000 mg | Freq: Two times a day (BID) | INTRAVENOUS | Status: DC
  Administered 2018-05-14 – 2018-05-16 (×4): 20 mg via INTRAVENOUS
  Filled 2018-05-14 (×4): qty 50

## 2018-05-14 MED ORDER — ONDANSETRON HCL 4 MG/2ML IJ SOLN
INTRAMUSCULAR | Status: AC
  Filled 2018-05-14: qty 2

## 2018-05-14 MED ORDER — ACETAMINOPHEN 160 MG/5ML PO SOLN
650.0000 mg | Freq: Four times a day (QID) | ORAL | Status: DC
  Administered 2018-05-15 – 2018-05-16 (×4): 650 mg via ORAL
  Filled 2018-05-14 (×5): qty 20.3

## 2018-05-14 MED ORDER — GABAPENTIN 300 MG PO CAPS
300.0000 mg | ORAL_CAPSULE | ORAL | Status: AC
  Administered 2018-05-14: 300 mg via ORAL
  Filled 2018-05-14: qty 1

## 2018-05-14 MED ORDER — PROPOFOL 10 MG/ML IV BOLUS
INTRAVENOUS | Status: DC | PRN
  Administered 2018-05-14: 200 mg via INTRAVENOUS

## 2018-05-14 MED ORDER — 0.9 % SODIUM CHLORIDE (POUR BTL) OPTIME
TOPICAL | Status: DC | PRN
  Administered 2018-05-14: 1000 mL

## 2018-05-14 MED ORDER — SODIUM CHLORIDE 0.9 % IJ SOLN
INTRAMUSCULAR | Status: AC
  Filled 2018-05-14: qty 20

## 2018-05-14 MED ORDER — KETAMINE HCL 10 MG/ML IJ SOLN
INTRAMUSCULAR | Status: DC | PRN
  Administered 2018-05-14: 30 mg via INTRAVENOUS

## 2018-05-14 MED ORDER — OXYCODONE HCL 5 MG/5ML PO SOLN
5.0000 mg | ORAL | Status: DC | PRN
  Administered 2018-05-15 – 2018-05-16 (×3): 5 mg via ORAL
  Filled 2018-05-14 (×3): qty 5

## 2018-05-14 SURGICAL SUPPLY — 56 items
APPLICATOR COTTON TIP 6 STRL (MISCELLANEOUS) ×2 IMPLANT
APPLICATOR COTTON TIP 6IN STRL (MISCELLANEOUS) ×4
APPLIER CLIP 5 13 M/L LIGAMAX5 (MISCELLANEOUS)
APPLIER CLIP ROT 10 11.4 M/L (STAPLE)
APPLIER CLIP ROT 13.4 12 LRG (CLIP)
BLADE SURG 15 STRL LF DISP TIS (BLADE) ×1 IMPLANT
BLADE SURG 15 STRL SS (BLADE) ×1
CABLE HIGH FREQUENCY MONO STRZ (ELECTRODE) ×2 IMPLANT
CLIP APPLIE 5 13 M/L LIGAMAX5 (MISCELLANEOUS) IMPLANT
CLIP APPLIE ROT 10 11.4 M/L (STAPLE) IMPLANT
CLIP APPLIE ROT 13.4 12 LRG (CLIP) IMPLANT
COVER WAND RF STERILE (DRAPES) IMPLANT
DERMABOND ADVANCED (GAUZE/BANDAGES/DRESSINGS)
DERMABOND ADVANCED .7 DNX12 (GAUZE/BANDAGES/DRESSINGS) IMPLANT
DEVICE SUT QUICK LOAD TK 5 (STAPLE) ×2 IMPLANT
DEVICE SUT TI-KNOT TK 5X26 (MISCELLANEOUS) ×2 IMPLANT
DEVICE SUTURE ENDOST 10MM (ENDOMECHANICALS) ×2 IMPLANT
DISSECTOR BLUNT TIP ENDO 5MM (MISCELLANEOUS) IMPLANT
ELECT REM PT RETURN 15FT ADLT (MISCELLANEOUS) ×2 IMPLANT
GAUZE SPONGE 4X4 12PLY STRL (GAUZE/BANDAGES/DRESSINGS) IMPLANT
GLOVE BIOGEL M 8.0 STRL (GLOVE) ×2 IMPLANT
GOWN STRL REUS W/TWL XL LVL3 (GOWN DISPOSABLE) ×8 IMPLANT
GRASPER SUT TROCAR 14GX15 (MISCELLANEOUS) ×2 IMPLANT
HANDLE STAPLE EGIA 4 XL (STAPLE) ×2 IMPLANT
HOVERMATT SINGLE USE (MISCELLANEOUS) ×2 IMPLANT
KIT BASIN OR (CUSTOM PROCEDURE TRAY) ×2 IMPLANT
MARKER SKIN DUAL TIP RULER LAB (MISCELLANEOUS) ×2 IMPLANT
NEEDLE SPNL 22GX3.5 QUINCKE BK (NEEDLE) ×2 IMPLANT
PACK UNIVERSAL I (CUSTOM PROCEDURE TRAY) ×2 IMPLANT
RELOAD TRI 45 ART MED THCK BLK (STAPLE) ×2 IMPLANT
RELOAD TRI 45 ART MED THCK PUR (STAPLE) IMPLANT
RELOAD TRI 60 ART MED THCK BLK (STAPLE) ×2 IMPLANT
RELOAD TRI 60 ART MED THCK PUR (STAPLE) ×4 IMPLANT
SCISSORS LAP 5X45 EPIX DISP (ENDOMECHANICALS) ×2 IMPLANT
SET IRRIG TUBING LAPAROSCOPIC (IRRIGATION / IRRIGATOR) ×2 IMPLANT
SHEARS HARMONIC ACE PLUS 45CM (MISCELLANEOUS) ×2 IMPLANT
SLEEVE ADV FIXATION 5X100MM (TROCAR) ×4 IMPLANT
SLEEVE GASTRECTOMY 36FR VISIGI (MISCELLANEOUS) ×2 IMPLANT
SOLUTION ANTI FOG 6CC (MISCELLANEOUS) ×2 IMPLANT
SPONGE LAP 18X18 RF (DISPOSABLE) ×2 IMPLANT
STAPLER VISISTAT 35W (STAPLE) ×2 IMPLANT
SUT MNCRL AB 4-0 PS2 18 (SUTURE) ×4 IMPLANT
SUT SURGIDAC NAB ES-9 0 48 120 (SUTURE) IMPLANT
SUT VICRYL 0 TIES 12 18 (SUTURE) ×2 IMPLANT
SYR 10ML ECCENTRIC (SYRINGE) ×2 IMPLANT
SYR 20CC LL (SYRINGE) ×2 IMPLANT
SYR 50ML LL SCALE MARK (SYRINGE) ×2 IMPLANT
TOWEL OR 17X26 10 PK STRL BLUE (TOWEL DISPOSABLE) ×4 IMPLANT
TOWEL OR NON WOVEN STRL DISP B (DISPOSABLE) ×2 IMPLANT
TROCAR ADV FIXATION 5X100MM (TROCAR) ×2 IMPLANT
TROCAR BLADELESS 15MM (ENDOMECHANICALS) ×2 IMPLANT
TROCAR BLADELESS OPT 5 100 (ENDOMECHANICALS) ×2 IMPLANT
TUBE CALIBRATION LAPBAND (TUBING) ×2 IMPLANT
TUBING CONNECTING 10 (TUBING) ×2 IMPLANT
TUBING ENDO SMARTCAP (MISCELLANEOUS) ×2 IMPLANT
TUBING INSUF HEATED (TUBING) ×2 IMPLANT

## 2018-05-14 NOTE — Anesthesia Preprocedure Evaluation (Signed)
Anesthesia Evaluation  Patient identified by MRN, date of birth, ID band Patient awake    Reviewed: Allergy & Precautions, NPO status , Patient's Chart, lab work & pertinent test results  Airway Mallampati: II  TM Distance: <3 FB Neck ROM: Full    Dental no notable dental hx.    Pulmonary asthma ,    Pulmonary exam normal breath sounds clear to auscultation + decreased breath sounds      Cardiovascular hypertension, Normal cardiovascular exam Rhythm:Regular Rate:Normal     Neuro/Psych Bipolar Disorder negative neurological ROS     GI/Hepatic negative GI ROS, Neg liver ROS,   Endo/Other  Morbid obesity  Renal/GU negative Renal ROS  negative genitourinary   Musculoskeletal negative musculoskeletal ROS (+)   Abdominal (+) + obese,   Peds negative pediatric ROS (+)  Hematology negative hematology ROS (+)   Anesthesia Other Findings   Reproductive/Obstetrics negative OB ROS                             Anesthesia Physical Anesthesia Plan  ASA: III  Anesthesia Plan: General   Post-op Pain Management:    Induction: Intravenous  PONV Risk Score and Plan: 3 and Ondansetron, Dexamethasone, Scopolamine patch - Pre-op, Midazolam and Treatment may vary due to age or medical condition  Airway Management Planned: Oral ETT  Additional Equipment:   Intra-op Plan:   Post-operative Plan: Extubation in OR  Informed Consent: I have reviewed the patients History and Physical, chart, labs and discussed the procedure including the risks, benefits and alternatives for the proposed anesthesia with the patient or authorized representative who has indicated his/her understanding and acceptance.   Dental advisory given  Plan Discussed with: CRNA and Surgeon  Anesthesia Plan Comments:         Anesthesia Quick Evaluation

## 2018-05-14 NOTE — Anesthesia Procedure Notes (Signed)
Procedure Name: Intubation Date/Time: 05/14/2018 3:48 PM Performed by: Vanessa Burley, CRNA Pre-anesthesia Checklist: Patient identified, Emergency Drugs available, Suction available and Patient being monitored Patient Re-evaluated:Patient Re-evaluated prior to induction Oxygen Delivery Method: Circle system utilized Preoxygenation: Pre-oxygenation with 100% oxygen Induction Type: IV induction Ventilation: Mask ventilation without difficulty Laryngoscope Size: 2 and Miller Grade View: Grade I Tube type: Oral Tube size: 7.5 mm Number of attempts: 1 Airway Equipment and Method: Stylet Placement Confirmation: ETT inserted through vocal cords under direct vision,  positive ETCO2 and breath sounds checked- equal and bilateral Secured at: 22 cm Tube secured with: Tape Dental Injury: Teeth and Oropharynx as per pre-operative assessment

## 2018-05-14 NOTE — Transfer of Care (Signed)
Immediate Anesthesia Transfer of Care Note  Patient: Jessica Castro  Procedure(s) Performed: LAPAROSCOPIC GASTRIC SLEEVE RESECTION WITH UPPER ENDO AND ERAS PATHWAY (N/A )  Patient Location: PACU  Anesthesia Type:General  Level of Consciousness: awake, alert , oriented and patient cooperative  Airway & Oxygen Therapy: Patient Spontanous Breathing and Patient connected to face mask  Post-op Assessment: Report given to RN and Post -op Vital signs reviewed and stable  Post vital signs: Reviewed and stable  Last Vitals:  Vitals Value Taken Time  BP 118/49 05/14/2018  5:42 PM  Temp    Pulse 100 05/14/2018  5:43 PM  Resp    SpO2 100 % 05/14/2018  5:43 PM  Vitals shown include unvalidated device data.  Last Pain:  Vitals:   05/14/18 1337  TempSrc:   PainSc: 0-No pain      Patients Stated Pain Goal: 5 (05/14/18 1337)  Complications: No apparent anesthesia complications

## 2018-05-14 NOTE — Progress Notes (Signed)
Received patient from PACU via stretcher. Sleepy but arouses easily. Respirations regular and unlabored. 6 ports with glue clean with no drainage. VSS. Lina Sar, RN

## 2018-05-14 NOTE — Discharge Instructions (Signed)
° ° ° °GASTRIC BYPASS/SLEEVE ° Home Care Instructions ° ° These instructions are to help you care for yourself when you go home. ° °Call: If you have any problems. °• Call [phone] and ask for the surgeon on call °• If you need immediate help, come to the ER at [facility].  °• Tell the ER staff that you are a new post-op gastric bypass or gastric sleeve patient °  °Signs and symptoms to report: • Severe vomiting or nausea °o If you cannot keep down clear liquids for longer than 1 day, call your surgeon  °• Abdominal pain that does not get better after taking your pain medication °• Fever over 100.4° F with chills °• Heart beating over 100 beats a minute °• Shortness of breath at rest °• Chest pain °•  Redness, swelling, drainage, or foul odor at incision (surgical) sites °•  If your incisions open or pull apart °• Swelling or pain in calf (lower leg) °• Diarrhea (Loose bowel movements that happen often), frequent watery, uncontrolled bowel movements °• Constipation, (no bowel movements for 3 days) if this happens: Pick one °o Milk of Magnesia, 2 tablespoons by mouth, 3 times a day for 2 days if needed °o Stop taking Milk of Magnesia once you have a bowel movement °o Call your doctor if constipation continues °Or °o Miralax  (instead of Milk of Magnesia) following the label instructions °o Stop taking Miralax once you have a bowel movement °o Call your doctor if constipation continues °• Anything you think is not normal °  °Normal side effects after surgery: • Unable to sleep at night or unable to focus °• Irritability or moody °• Being tearful (crying) or depressed °These are common complaints, possibly related to your anesthesia medications that put you to sleep, stress of surgery, and change in lifestyle.  This usually goes away a few weeks after surgery.  If these feelings continue, call your primary care doctor. °  °Wound Care: You may have surgical glue, steri-strips, or staples over your incisions after  surgery °• Surgical glue:  Looks like a clear film over your incisions and will wear off a little at a time °• Steri-strips: Strips of tape over your incisions. You may notice a yellowish color on the skin under the steri-strips. This is used to make the   steri-strips stick better. Do not pull the steri-strips off - let them fall off °• Staples: Staples may be removed before you leave the hospital °o If you go home with staples, call [facility], [phone] at for an appointment with your surgeon’s nurse to have staples removed 10 days after surgery. °• Showering: You may shower two (2) days after your surgery unless your surgeon tells you differently °o Wash gently around incisions with warm soapy water, rinse well, and gently pat dry  °o No tub baths until staples are removed, steri-strips fall off or glue is gone.  °  °Medications: • Medications should be liquid or crushed if larger than the size of a dime °• Extended release pills (medication that release a little bit at a time through the day) should NOT be crushed or cut. (examples include XL, ER, DR, SR) °• Depending on the size and number of medications you take, you may need to space (take a few throughout the day)/change the time you take your medications so that you do not over-fill your pouch (smaller stomach) °• Make sure you follow-up with your primary care doctor to   make medication changes needed during rapid weight loss and life-style changes °• If you have diabetes, follow up with the doctor that orders your diabetes medication(s) within one week after surgery and check your blood sugar regularly. °• Do not drive while taking prescription pain medication  °• It is ok to take Tylenol by the bottle instructions with your pain medicine or instead of your pain medicine as needed.  DO NOT TAKE NSAIDS (EXAMPLES OF NSAIDS:  IBUPROFREN/ NAPROXEN)  °Diet:                    First 2 Weeks ° You will see the dietician t about two (2) weeks  after your surgery. The dietician will increase the types of foods you can eat if you are handling liquids well: °• If you have severe vomiting or nausea and cannot keep down clear liquids lasting longer than 1 day, call your surgeon @ ([phone]) °Protein Shake °• Drink at least 2 ounces of shake 5-6 times per day °• Each serving of protein shakes (usually 8 - 12 ounces) should have: °o 15 grams of protein  °o And no more than 5 grams of carbohydrate  °• Goal for protein each day: °o Men = 80 grams per day °o Women = 60 grams per day °• Protein powder may be added to fluids such as non-fat milk or Lactaid milk or unsweetened Soy/Almond milk (limit to 35 grams added protein powder per serving) ° °Hydration °• Slowly increase the amount of water and other clear liquids as tolerated (See Acceptable Fluids) °• Slowly increase the amount of protein shake as tolerated  °•  Sip fluids slowly and throughout the day.  Do not use straws. °• May use sugar substitutes in small amounts (no more than 6 - 8 packets per day; i.e. Splenda) ° °Fluid Goal °• The first goal is to drink at least 8 ounces of protein shake/drink per day (or as directed by the nutritionist); some examples of protein shakes are Syntrax Nectar, Adkins Advantage, EAS Edge HP, and Unjury. See handout from pre-op Bariatric Education Class: °o Slowly increase the amount of protein shake you drink as tolerated °o You may find it easier to slowly sip shakes throughout the day °o It is important to get your proteins in first °• Your fluid goal is to drink 64 - 100 ounces of fluid daily °o It may take a few weeks to build up to this °• 32 oz (or more) should be clear liquids  °And  °• 32 oz (or more) should be full liquids (see below for examples) °• Liquids should not contain sugar, caffeine, or carbonation ° °Clear Liquids: °• Water or Sugar-free flavored water (i.e. Fruit H2O, Propel) °• Decaffeinated coffee or tea (sugar-free) °• Crystal Lite, Wyler’s Lite,  Minute Maid Lite °• Sugar-free Jell-O °• Bouillon or broth °• Sugar-free Popsicle:   *Less than 20 calories each; Limit 1 per day ° °Full Liquids: °Protein Shakes/Drinks + 2 choices per day of other full liquids °• Full liquids must be: °o No More Than 15 grams of Carbs per serving  °o No More Than 3 grams of Fat per serving °• Strained low-fat cream soup (except Cream of Potato or Tomato) °• Non-Fat milk °• Fat-free Lactaid Milk °• Unsweetened Soy Or Unsweetened Almond Milk °• Low Sugar yogurt (Dannon Lite & Fit, Greek yogurt; Oikos Triple Zero; Chobani Simply 100; Yoplait 100 calorie Greek - No Fruit on the Bottom) ° °  °Vitamins   and Minerals • Start 1 day after surgery unless otherwise directed by your surgeon °• 2 Chewable Bariatric Specific Multivitamin / Multimineral Supplement with iron (Example: Bariatric Advantage Multi EA) °• Chewable Calcium with Vitamin D-3 °(Example: 3 Chewable Calcium Plus 600 with Vitamin D-3) °o Take 500 mg three (3) times a day for a total of 1500 mg each day °o Do not take all 3 doses of calcium at one time as it may cause constipation, and you can only absorb 500 mg  at a time  °o Do not mix multivitamins containing iron with calcium supplements; take 2 hours apart °• Menstruating women and those with a history of anemia (a blood disease that causes weakness) may need extra iron °o Talk with your doctor to see if you need more iron °• Do not stop taking or change any vitamins or minerals until you talk to your dietitian or surgeon °• Your Dietitian and/or surgeon must approve all vitamin and mineral supplements °  °Activity and Exercise: Limit your physical activity as instructed by your doctor.  It is important to continue walking at home.  During this time, use these guidelines: °• Do not lift anything greater than ten (10) pounds for at least two (2) weeks °• Do not go back to work or drive until your surgeon says you can °• You may have sex when you feel comfortable  °o It is  VERY important for female patients to use a reliable birth control method; fertility often increases after surgery  °o All hormonal birth control will be ineffective for 30 days after surgery due to medications given during surgery a barrier method must be used. °o Do not get pregnant for at least 18 months °• Start exercising as soon as your doctor tells you that you can °o Make sure your doctor approves any physical activity °• Start with a simple walking program °• Walk 5-15 minutes each day, 7 days per week.  °• Slowly increase until you are walking 30-45 minutes per day °Consider joining our BEL[employer] program. [phone] or email [email or web address] °  °Special Instructions Things to remember: °• Use your CPAP when sleeping if this applies to you ° °• [facility] has two free Bariatric Surgery Support Groups that meet monthly °o The 3rd Thursday of each month, 6 pm, [facility]  °o The 2nd Friday of each month, 11:45 am in the private dining room in the basement of [place] °• It is very important to keep all follow up appointments with your surgeon, dietitian, primary care physician, and behavioral health practitioner °• Routine follow up schedule with your surgeon include appointments at 2-3 weeks, 6-8 weeks, 6 months, and 1 year at a minimum.  Your surgeon may request to see you more often.   °o After the first year, please follow up with your bariatric surgeon and dietitian at least once a year in order to maintain best weight loss results °[facility]: [phone] °[facility]: [phone] °Bariatric Nurse Coordinator: [phone] °  °   Reviewed and Endorsed  °by [facility] Patient Education Committee, June, 2016 °Edits Approved: Aug, 2018 ° ° ° °

## 2018-05-14 NOTE — Interval H&P Note (Signed)
History and Physical Interval Note:  05/14/2018 2:56 PM  Jessica Castro  has presented today for surgery, with the diagnosis of MORBID OBESITY  The various methods of treatment have been discussed with the patient and family. After consideration of risks, benefits and other options for treatment, the patient has consented to  Procedure(s): LAPAROSCOPIC GASTRIC SLEEVE RESECTION WITH UPPER ENDO AND ERAS PATHWAY (N/A) as a surgical intervention .  The patient's history has been reviewed, patient examined, no change in status, stable for surgery.  I have reviewed the patient's chart and labs.  Questions were answered to the patient's satisfaction.     Valarie Merino

## 2018-05-14 NOTE — Op Note (Signed)
14 May 2018  Surgeon: Wenda Low, MD, FACS  Asst:  Jaclynn Guarneri, MD, FACS  Anes:  General endotracheal  Procedure: Laparoscopic sleeve gastrectomy and upper endoscopy  Diagnosis: Morbid obesity  Complications: none  EBL:   5 cc  Description of Procedure:  The patient was take to OR 2 and given general anesthesia.  The abdomen was prepped with Technicare and draped sterilely.  A timeout was performed.  Access to the abdomen was achieved with a 5 mm Optiview through the left upper quadrant.  Total 6 ports were used including the 1215 in the right side and the Nathanson retractor port in the upper midline.  Also placed in Exparel block at the beginning of the case on either side.  With the [facility] in place there were adhesions from the undersurface of the left lateral segment to the retro-gastric window which I took down.  She had a history of GERD and there was a question of a hiatal hernia on her upper GI which I had reviewed and things did not look exactly straightforward regarding that.  Her anatomy was interesting in that the EG junction was a little more anterior and there was no visible dimple.  We passed the calibration tubing and a 10 cc inflation with a balloon held fast at the EG junction and there was no sliding hiatal hernia noted..   The ViSiGi 36Fr tube was inserted to deflate the stomach and was pulled back into the esophagus.    The pylorus was identified and we measured about 6 cm back and marked the antrum cm back and marked the antrum.  At that point we began dissection to take down the greater curvature of the stomach using the Harmonic scalpel.  This dissection was taken all the way up to the left crus.  Posterior attachments of the stomach were also taken down.  The fundus superiorly was really stuck to the spleen and we took it down off the left crus.  The ViSiGi tube was then passed into the antrum and suction applied so that it was snug along the lessor  curvature.  The "crow's foot" or incisura was identified.  The sleeve gastrectomy was begun using the [employer] stapler beginning with a 4.5 cm black load with TRS.  This was followed by 6 cm black load with TRS.  Subsequent firings were purple loads with TRS to complete the sleeve.  When the sleeve was complete the tube was taken off suction and insufflated briefly and this demonstrated good some symmetrical firings of the stapler.  The sleeve was a little bulbous where there had been a posterior attachment that I took down.  Overall the sleeve good look good and no bleeding or leaks were seen..  The tube was withdrawn.  Upper endoscopy was then performed by Dr. Dr. Johna Sheriff..     The specimen was extracted through the 15 trocar site.  The Endo Close was used to place a single suture of 0 Vicryl to close that port.  Local was provided by infiltrating with Exparel placed as a block at the first of the case and closed 4-0 Monocryl and Dermabond.  The patient tolerated the procedure well was taken to recovery room in satisfactory condition.  Matt B. Daphine Deutscher, MD, [facility], [place] [phone]

## 2018-05-14 NOTE — Op Note (Signed)
Procedure: Upper GI endoscopy  Description of procedure: Upper GI endoscopy is performed at the completion of laparoscopic sleeve gastrectomy by Dr.  Daphine Deutscher.  The video endoscope was introduced into the upper esophagus and then passed to the EG junction at about 40 cm. The esophagus appeared normal. The gastric sleeve was entered. The sleeve was tensely distended with air while the outlet was obstructed under saline irrigation by the operating surgeon. There was no evidence of leak. The staple line was intact and without bleeding. The scope was advanced to the antrum and pylorus visualized. There was no stricture or twisting or mucosal abnormality, and minimal if any narrowing noted at the incisura.  The pouch was then desufflated and the scope withdrawn.  Mariella Saa MD, FACS  05/14/2018, 5:21 PM

## 2018-05-15 ENCOUNTER — Encounter (HOSPITAL_COMMUNITY): Payer: Self-pay | Admitting: Surgery

## 2018-05-15 LAB — CBC WITH DIFFERENTIAL/PLATELET
ABS IMMATURE GRANULOCYTES: 0.02 10*3/uL (ref 0.00–0.07)
BASOS ABS: 0 10*3/uL (ref 0.0–0.1)
Basophils Relative: 0 %
Eosinophils Absolute: 0 10*3/uL (ref 0.0–0.5)
Eosinophils Relative: 0 %
HCT: 38.2 % (ref 36.0–46.0)
HEMOGLOBIN: 11.8 g/dL — AB (ref 12.0–15.0)
IMMATURE GRANULOCYTES: 0 %
LYMPHS PCT: 11 %
Lymphs Abs: 1 10*3/uL (ref 0.7–4.0)
MCH: 26.2 pg (ref 26.0–34.0)
MCHC: 30.9 g/dL (ref 30.0–36.0)
MCV: 84.7 fL (ref 80.0–100.0)
Monocytes Absolute: 0.3 10*3/uL (ref 0.1–1.0)
Monocytes Relative: 4 %
NEUTROS ABS: 7.7 10*3/uL (ref 1.7–7.7)
NRBC: 0 % (ref 0.0–0.2)
Neutrophils Relative %: 85 %
PLATELETS: 250 10*3/uL (ref 150–400)
RBC: 4.51 MIL/uL (ref 3.87–5.11)
RDW: 13.7 % (ref 11.5–15.5)
WBC: 9 10*3/uL (ref 4.0–10.5)

## 2018-05-15 NOTE — Progress Notes (Signed)
Started protein shake.  Instructed small sips and to stop if nausea increases.

## 2018-05-15 NOTE — Telephone Encounter (Signed)
Per review of Epic, patient scheduled for laparoscopic gastric sleeve on 05/14/18.   Leota Sauers, CNM -please advise on colpo.

## 2018-05-15 NOTE — Progress Notes (Signed)
Patient alert and oriented, Post op day 1.  Provided support and encouragement.  Encouraged pulmonary toilet, ambulation and small sips of liquids.  Struggling with nausea at this time, medicated with antiemetic 4 ounces of water since surgery.  All questions answered.  Will continue to monitor.

## 2018-05-15 NOTE — Progress Notes (Signed)
PHARMACY CONSULT FOR:  Risk Assessment for Post-Discharge VTE Following Bariatric Surgery  Post-Discharge VTE Risk Assessment: This patient's probability of 30-day post-discharge VTE is increased due to the factors marked:   Female    Age >/=60 years    BMI >/=50 kg/m2    CHF    Dyspnea at Rest    Paraplegia   X Non-gastric-band surgery    Operation Time >/=3 hr    Return to OR     Length of Stay >/= 3 d   Predicted probability of 30-day post-discharge VTE: 0.16%  Other patient-specific factors to consider: none   Recommendation for Discharge: No pharmacologic prophylaxis post-discharge  Jessica Castro is a 32 y.o. female who underwent  laparoscopic sleeve gastrectomy on 05/14/18.     Allergies  Allergen Reactions  . Junel Fe 1-20 [Norethin Ace-Eth Estrad-Fe]     Hives with this brand. microgestin is fine  . Latex     Causes eczema flares   . Peanuts [Peanut Oil] Hives    Tree nuts  . Adhesive [Tape] Rash    Patient Measurements: Height: 5\' 7"  (170.2 cm) Weight: 297 lb 4 oz (134.8 kg) IBW/kg (Calculated) : 61.6 Body mass index is 46.56 kg/m.  Recent Labs    05/14/18 2143 05/15/18 0518  WBC 12.7* 9.0  HGB 11.9* 11.8*  HCT 38.3 38.2  PLT 248 250  CREATININE 0.97  --    Estimated Creatinine Clearance: 120.6 mL/min (by C-G formula based on SCr of 0.97 mg/dL).    Past Medical History:  Diagnosis Date  . Allergic rhinitis    Allergy- shots with Dr Sharyn Lull  . Allergy   . Amenorrhea   . Anxiety    has a history of panic disorder  . Asthma   . Bipolar 2 disorder (HCC)   . Depression    treated with anxiety- current  counseling   . GERD (gastroesophageal reflux disease)   . Herniated disc    back  . History of bronchitis   . History of chicken pox    childhood  . History of headache   . History of migraines    last was 1 month ago   . Hypertension   . Menorrhagia   . PCOS (polycystic ovarian syndrome)    takes Metformin      Medications Prior  to Admission  Medication Sig Dispense Refill Last Dose  . cetirizine (ZYRTEC) 10 MG tablet Take 10 mg by mouth daily as needed for allergies.   Past Week at Unknown time  . escitalopram (LEXAPRO) 5 MG tablet Take 5 mg by mouth at bedtime.  2 05/13/2018 at Unknown time  . ibuprofen (ADVIL,MOTRIN) 200 MG tablet Take 400 mg by mouth daily as needed for headache or moderate pain.   Past Week at Unknown time  . lamoTRIgine (LAMICTAL) 150 MG tablet Take 150 mg by mouth at bedtime.   1 05/13/2018 at Unknown time  . metFORMIN (GLUCOPHAGE-XR) 500 MG 24 hr tablet Take 500 mg by mouth at bedtime.   3 05/13/2018 at Unknown time  . Multiple Vitamins-Minerals (BARIATRIC MULTIVITAMINS/IRON PO) Take 1 tablet by mouth daily.   05/13/2018 at Unknown time  . norethindrone-ethinyl estradiol (BLISOVI FE 1/20) 1-20 MG-MCG tablet Take 1 tablet by mouth daily. 3 Package 4 Past Month at Unknown time  . omeprazole (PRILOSEC) 20 MG capsule Take 20 mg by mouth at bedtime.   99 Past Week at Unknown time  . propranolol (INDERAL) 20 MG tablet Take 20 mg  by mouth every 6 (six) hours as needed for anxiety.  2 05/14/2018 at 1200  . fluticasone (FLONASE) 50 MCG/ACT nasal spray Place 1 spray into both nostrils daily as needed for allergies or rhinitis.   More than a month at Unknown time     Herby Abraham, Pharm.D [phone] 05/15/2018 11:27 AM

## 2018-05-15 NOTE — Plan of Care (Signed)

## 2018-05-15 NOTE — Progress Notes (Signed)
Patient ID: Jessica Castro, female   DOB: 1986-06-13, 32 y.o.   MRN: [id number] [facility] Progress Note:   1 Day Post-Op  Subjective: Mental status is clear.   Objective: Vital signs in last 24 hours: Temp:  [97.6 F (36.4 C)-98.7 F (37.1 C)] 97.7 F (36.5 C) (10/15 0953) Pulse Rate:  [60-95] 65 (10/15 0953) Resp:  [13-20] 18 (10/15 0953) BP: (94-151)/(49-93) 135/89 (10/15 0953) SpO2:  [96 %-100 %] 99 % (10/15 0953) Weight:  [134.8 kg] 134.8 kg (10/14 1316)  Intake/Output from previous day: 10/14 0701 - 10/15 0700 In: 2717.2 [P.O.:90; I.V.:2577.2; IV Piggyback:50] Out: 820 [Urine:800; Blood:20] Intake/Output this shift: Total I/O In: 490.1 [P.O.:30; I.V.:410.1; IV Piggyback:50] Out: 500 [Urine:500]  Physical Exam: Work of breathing is not labored.  Having some nausea  Lab Results:  Results for orders placed or performed during the hospital encounter of 05/14/18 (from the past 48 hour(s))  Pregnancy, urine STAT morning of surgery     Status: None   Collection Time: 05/14/18  1:01 PM  Result Value Ref Range   Preg Test, Ur NEGATIVE NEGATIVE    Comment:        THE SENSITIVITY OF THIS METHODOLOGY IS >20 mIU/mL. Performed at [facility], [place] [address] [place], [place] [address]   CBC     Status: Abnormal   Collection Time: 05/14/18  9:43 PM  Result Value Ref Range   WBC 12.7 (H) 4.0 - 10.5 K/uL   RBC 4.58 3.87 - 5.11 MIL/uL   Hemoglobin 11.9 (L) 12.0 - 15.0 g/dL   HCT 38.3 36.0 - 46.0 %   MCV 83.6 80.0 - 100.0 fL   MCH 26.0 26.0 - 34.0 pg   MCHC 31.1 30.0 - 36.0 g/dL   RDW 13.8 11.5 - 15.5 %   Platelets 248 150 - 400 K/uL   nRBC 0.0 0.0 - 0.2 %    Comment: Performed at [facility], [place] [address], [place], [place] [address]  Creatinine, serum     Status: None   Collection Time: 05/14/18  9:43 PM  Result Value Ref Range   Creatinine, Ser 0.97 0.44 - 1.00 mg/dL   GFR calc non Af Amer >60 >60 mL/min   GFR calc Af  Amer >60 >60 mL/min    Comment: (NOTE) The eGFR has been calculated using the CKD EPI equation. This calculation has not been validated in all clinical situations. eGFR's persistently <60 mL/min signify possible Chronic Kidney Disease. Performed at [facility], [place] [address], [place], [place] [address]   CBC WITH DIFFERENTIAL     Status: Abnormal   Collection Time: 05/15/18  5:18 AM  Result Value Ref Range   WBC 9.0 4.0 - 10.5 K/uL   RBC 4.51 3.87 - 5.11 MIL/uL   Hemoglobin 11.8 (L) 12.0 - 15.0 g/dL   HCT 38.2 36.0 - 46.0 %   MCV 84.7 80.0 - 100.0 fL   MCH 26.2 26.0 - 34.0 pg   MCHC 30.9 30.0 - 36.0 g/dL   RDW 13.7 11.5 - 15.5 %   Platelets 250 150 - 400 K/uL   nRBC 0.0 0.0 - 0.2 %   Neutrophils Relative % 85 %   Neutro Abs 7.7 1.7 - 7.7 K/uL   Lymphocytes Relative 11 %   Lymphs Abs 1.0 0.7 - 4.0 K/uL   Monocytes Relative 4 %   Monocytes Absolute 0.3 0.1 - 1.0 K/uL   Eosinophils Relative 0 %  Eosinophils Absolute 0.0 0.0 - 0.5 K/uL   Basophils Relative 0 %   Basophils Absolute 0.0 0.0 - 0.1 K/uL   Immature Granulocytes 0 %   Abs Immature Granulocytes 0.02 0.00 - 0.07 K/uL    Comment: Performed at [facility], [place] [address] [place], [place] [address]    Radiology/Results: No results found.  Anti-infectives: Anti-infectives (From admission, onward)   Start     Dose/Rate Route Frequency Ordered Stop   05/14/18 1400  cefoTEtan (CEFOTAN) 2 g in sodium chloride 0.9 % 100 mL IVPB     2 g 200 mL/hr over 30 Minutes Intravenous On call to O.R. 05/14/18 1300 05/14/18 1601      Assessment/Plan: Problem List: Patient Active Problem List   Diagnosis Date Noted  . S/P laparoscopic sleeve gastrectomy Oct 2019 05/14/2018  . Depression 06/07/2017  . PCOS (polycystic ovarian syndrome) 08/06/2014  . Bilateral ovarian cysts per GYN 01/2014 03/06/2014  . Onychocryptosis 07/23/2013  . Anxiety and depression 07/14/2013  . GERD  (gastroesophageal reflux disease) 07/14/2013  . Morbid obesity ([place]) 07/14/2013  . Herniated lumbar disc without myelopathy 07/14/2013  . Sinusitis 07/01/2013  . Paronychia of toe of right foot 07/01/2013  . Urinary frequency 05/09/2012  . Hemorrhoids 06/02/2011  . Constipation 03/21/2011  . Lumbar back pain 03/21/2011  . Extrinsic asthma 03/21/2011  . Allergic rhinitis 03/21/2011  . Migraine 03/21/2011    Not drinking enough for discharge.   1 Day Post-Op    LOS: 1 day   Matt B. Hassell Done, MD, [facility] [phone] beeper [phone]  05/15/2018 11:59 AM

## 2018-05-15 NOTE — Progress Notes (Signed)
Patient was given first cup of ice water. She given instructions on sipping water slowly and she demonstrated understanding. Will continue to monitor.

## 2018-05-16 LAB — CBC WITH DIFFERENTIAL/PLATELET
Abs Immature Granulocytes: 0.01 10*3/uL (ref 0.00–0.07)
BASOS ABS: 0 10*3/uL (ref 0.0–0.1)
Basophils Relative: 0 %
EOS ABS: 0 10*3/uL (ref 0.0–0.5)
Eosinophils Relative: 0 %
HCT: 37.2 % (ref 36.0–46.0)
Hemoglobin: 11.4 g/dL — ABNORMAL LOW (ref 12.0–15.0)
IMMATURE GRANULOCYTES: 0 %
LYMPHS ABS: 2.4 10*3/uL (ref 0.7–4.0)
LYMPHS PCT: 30 %
MCH: 26.3 pg (ref 26.0–34.0)
MCHC: 30.6 g/dL (ref 30.0–36.0)
MCV: 85.7 fL (ref 80.0–100.0)
Monocytes Absolute: 0.7 10*3/uL (ref 0.1–1.0)
Monocytes Relative: 9 %
NEUTROS PCT: 61 %
NRBC: 0 % (ref 0.0–0.2)
Neutro Abs: 4.9 10*3/uL (ref 1.7–7.7)
Platelets: 233 10*3/uL (ref 150–400)
RBC: 4.34 MIL/uL (ref 3.87–5.11)
RDW: 13.9 % (ref 11.5–15.5)
WBC: 8.1 10*3/uL (ref 4.0–10.5)

## 2018-05-16 MED ORDER — ONDANSETRON 4 MG PO TBDP: 4.0000 mg | ORAL_TABLET | Freq: Four times a day (QID) | ORAL | 0 refills | Status: DC | PRN

## 2018-05-16 MED ORDER — OXYCODONE HCL 5 MG/5ML PO SOLN: 5.0000 mg | Freq: Four times a day (QID) | ORAL | 0 refills | Status: DC | PRN

## 2018-05-16 MED ORDER — GABAPENTIN 300 MG PO CAPS: 300.0000 mg | ORAL_CAPSULE | Freq: Three times a day (TID) | ORAL | 0 refills | Status: DC

## 2018-05-16 MED ORDER — PANTOPRAZOLE SODIUM 40 MG PO TBEC: 40.0000 mg | DELAYED_RELEASE_TABLET | Freq: Every day | ORAL | 0 refills | Status: DC

## 2018-05-16 NOTE — Discharge Summary (Signed)
Physician Discharge Summary  Patient ID: Jessica Castro MRN: [id number] DOB/AGE: 1986-07-23 32 y.o.  PCP: Ileana Ladd, MD  Admit date: 05/14/2018 Discharge date: 05/16/2018  Admission Diagnoses:  Morbid obesity  Discharge Diagnoses:  same  Principal Problem:   S/P laparoscopic sleeve gastrectomy Oct 2019   Surgery:  Laparoscopic sleeve gastrectomy  Discharged Condition: improved  Hospital Course:   Had surgery.  Nausea on PD 1 that resolved and ready for discharge on PD 2  Consults: none  Significant Diagnostic Studies: none    Discharge Exam: Blood pressure (!) 140/92, pulse (!) 58, temperature 98.1 F (36.7 C), temperature source Oral, resp. rate 18, height 5\' 7"  (1.702 m), weight 134.8 kg, last menstrual period 05/10/2018, SpO2 98 %. Incisions OK.    Disposition: Discharge disposition: 01-Home or Self Care       Discharge Instructions    Ambulate hourly while awake   Complete by:  As directed    Call MD for:  difficulty breathing, headache or visual disturbances   Complete by:  As directed    Call MD for:  persistant dizziness or light-headedness   Complete by:  As directed    Call MD for:  persistant nausea and vomiting   Complete by:  As directed    Call MD for:  redness, tenderness, or signs of infection (pain, swelling, redness, odor or green/yellow discharge around incision site)   Complete by:  As directed    Call MD for:  severe uncontrolled pain   Complete by:  As directed    Call MD for:  temperature >101 F   Complete by:  As directed    Diet bariatric full liquid   Complete by:  As directed    Incentive spirometry   Complete by:  As directed    Perform hourly while awake     Allergies as of 05/16/2018      Reactions   Junel Fe 1-20 [norethin Ace-eth Estrad-fe]    Hives with this brand. microgestin is fine   Latex    Causes eczema flares    Peanuts [peanut Oil] Hives   Tree nuts   Adhesive [tape] Rash      Medication List     TAKE these medications   BARIATRIC MULTIVITAMINS/IRON PO Take 1 tablet by mouth daily.   cetirizine 10 MG tablet Commonly known as:  ZYRTEC Take 10 mg by mouth daily as needed for allergies.   escitalopram 5 MG tablet Commonly known as:  LEXAPRO Take 5 mg by mouth at bedtime.   fluticasone 50 MCG/ACT nasal spray Commonly known as:  FLONASE Place 1 spray into both nostrils daily as needed for allergies or rhinitis.   gabapentin 300 MG capsule Commonly known as:  NEURONTIN Take 1 capsule (300 mg total) by mouth every 8 (eight) hours. for 30 days   ibuprofen 200 MG tablet Commonly known as:  ADVIL,MOTRIN Take 400 mg by mouth daily as needed for headache or moderate pain. Notes to patient:  Avoid NSAIDs for 6-8 weeks after surgery   lamoTRIgine 150 MG tablet Commonly known as:  LAMICTAL Take 150 mg by mouth at bedtime.   metFORMIN 500 MG 24 hr tablet Commonly known as:  GLUCOPHAGE-XR Take 500 mg by mouth at bedtime. Notes to patient:  Monitor Blood Sugar Frequently and keep a log for primary care physician, you may need to adjust medication dosage with rapid weight loss.     norethindrone-ethinyl estradiol 1-20 MG-MCG tablet Commonly known as:  JUNEL  FE,GILDESS FE,LOESTRIN FE Take 1 tablet by mouth daily. Notes to patient:  This birth control will be ineffective for 30 days due to medication received prior to surgery   omeprazole 20 MG capsule Commonly known as:  PRILOSEC Take 20 mg by mouth at bedtime.   ondansetron 4 MG disintegrating tablet Commonly known as:  ZOFRAN-ODT Take 1 tablet (4 mg total) by mouth every 6 (six) hours as needed for nausea or vomiting.   oxyCODONE 5 MG/5ML solution Commonly known as:  ROXICODONE Take 5 mLs (5 mg total) by mouth every 6 (six) hours as needed for severe pain.   pantoprazole 40 MG tablet Commonly known as:  PROTONIX Take 1 tablet (40 mg total) by mouth daily.   propranolol 20 MG tablet Commonly known as:  INDERAL Take 20  mg by mouth every 6 (six) hours as needed for anxiety. Notes to patient:  Monitor Blood Pressure Daily and keep a log for primary care physician.  You may need to make changes to your medications with rapid weight loss.        Follow-up Information    Glenna Fellows, MD. Go on 05/29/2018.   Specialty:  General Surgery Why:  at 2 pm with Dr Jaclynn Guarneri. Contact information: [address] [place] [address] [phone]        Hedda Slade, PA-C .   Specialty:  General Surgery Contact information: [address] [place] [address] [place] [address] [phone]        Luretha Murphy, MD Follow up.   Specialty:  General Surgery Contact information: [address] [place] [address] [phone]           Signed: Valarie Merino 05/16/2018, 10:17 AM

## 2018-05-16 NOTE — Anesthesia Postprocedure Evaluation (Signed)
Anesthesia Post Note  Patient: Engineer, structural  Procedure(s) Performed: LAPAROSCOPIC GASTRIC SLEEVE RESECTION WITH UPPER ENDO AND ERAS PATHWAY (N/A )     Patient location during evaluation: PACU Anesthesia Type: General Level of consciousness: awake and alert Pain management: pain level controlled Vital Signs Assessment: post-procedure vital signs reviewed and stable Respiratory status: spontaneous breathing, nonlabored ventilation, respiratory function stable and patient connected to nasal cannula oxygen Cardiovascular status: blood pressure returned to baseline and stable Postop Assessment: no apparent nausea or vomiting Anesthetic complications: no    Last Vitals:  Vitals:   05/16/18 0122 05/16/18 0544  BP: (!) 154/82 (!) 140/92  Pulse: 60 (!) 58  Resp: 18 18  Temp: 36.8 C 36.7 C  SpO2: 97% 98%    Last Pain:  Vitals:   05/16/18 0916  TempSrc:   PainSc: 0-No pain                 Mayumi Summerson S

## 2018-05-16 NOTE — Telephone Encounter (Signed)
Try to schedule a month out from surgery

## 2018-05-16 NOTE — Progress Notes (Signed)
Patient alert and oriented, pain is controlled. Patient is tolerating fluids, advanced to protein shake today, patient is tolerating well.  Reviewed Gastric sleeve discharge instructions with patient and patient is able to articulate understanding.  Provided information on BELT program, Support Group and [facility] outpatient pharmacy. All questions answered, will continue to monitor.  Total fluid intake 750 Per dehydration protocol call back one week postop 

## 2018-05-16 NOTE — Progress Notes (Signed)
Discharge instructions reviewed with patient. All questions answered. Patient ambulated to vehicle with belongings by nurse tech 

## 2018-05-17 NOTE — Telephone Encounter (Signed)
Yes unless contraindicated with recent surgery, I did not see any other new medications

## 2018-05-17 NOTE — Telephone Encounter (Signed)
Spoke with patient. LMP 05/06/18. S/p lap gastric sleeve on 10/14.  Stopped OCP prior to surgery, has not been SA since stopping OCP.   Colpo scheduled for 05/31/18 at 11am with Leota Sauers, CNM. Instructed patient to continue to abstain from intercourse until after colpo. Unable to take motrin, instructed to take 2 regular strength tylenol with food and water one hour before procedure.  Leota Sauers, CNM- ok to proceed with colpo as scheduled?

## 2018-05-17 NOTE — Telephone Encounter (Signed)
Reviewed with Leota Sauers, CNM. Per review of Epic patient is scheduled for post-op f/u on 10/29 with surgeon for bariatric surgery, ok to proceed with colpo as schduled if no concerns.   Call returned to patient, no answer, unable to leave voicemail.

## 2018-05-18 NOTE — Telephone Encounter (Signed)
Spoke with patient. Advised per Leota Sauers, CNM. Patient aware to return call to office if any concerns/questions.  Routing to provider for final review. Patient is agreeable to disposition. Will close encounter.

## 2018-05-21 ENCOUNTER — Telehealth (HOSPITAL_COMMUNITY): Payer: Self-pay

## 2018-05-21 NOTE — Telephone Encounter (Addendum)
Voice mail left with patient to return call to discuss patient progress since surgery.  await return call to discuss post bariatric surgery follow up questions.  See below:  Call back 05/21/18 at 1159  1.  Tell me about your pain and pain management?no pain since surgery  2.  Let's talk about fluid intake.  How much total fluid are you taking in?60 ounces of fluid daily  3.  How much protein have you taken in the last 2 days?60 grams of protein  4.  Have you had nausea?  Tell me about when have experienced nausea and what you did to help?no nausea  5.  Has the frequency or color changed with your urine?urine light in color  6.  Tell me what your incisions look like?no problems with incisions  7.  Have you been passing gas? BM?BM after talking MOM and stool softner  8.  If a problem or question were to arise who would you call?  Do you know contact numbers for [facility], [facility], and [facility]?aware of how to contact all services  9.  How has the walking going?walking regularly been out to stores to get extra excercise  10.  How are your vitamins and calcium going?  How are you taking them?NO problems with MVI or calcium

## 2018-05-25 DIAGNOSIS — E7800 Pure hypercholesterolemia, unspecified: Secondary | ICD-10-CM | POA: Diagnosis not present

## 2018-05-25 DIAGNOSIS — Z23 Encounter for immunization: Secondary | ICD-10-CM | POA: Diagnosis not present

## 2018-05-25 DIAGNOSIS — Z9884 Bariatric surgery status: Secondary | ICD-10-CM | POA: Diagnosis not present

## 2018-05-25 DIAGNOSIS — E282 Polycystic ovarian syndrome: Secondary | ICD-10-CM | POA: Diagnosis not present

## 2018-05-29 ENCOUNTER — Encounter: Payer: 59 | Attending: Surgery | Admitting: Skilled Nursing Facility1

## 2018-05-29 DIAGNOSIS — Z713 Dietary counseling and surveillance: Secondary | ICD-10-CM | POA: Insufficient documentation

## 2018-05-29 DIAGNOSIS — E669 Obesity, unspecified: Secondary | ICD-10-CM

## 2018-05-30 ENCOUNTER — Encounter: Payer: Self-pay | Admitting: Skilled Nursing Facility1

## 2018-05-30 NOTE — Progress Notes (Signed)
Bariatric Class:  Appt start time: 1530 end time:  1630.  2 Week Post-Operative Nutrition Class  Patient was seen on 05/29/2018 for Post-Operative Nutrition education at [facility].   Surgery date: 05/14/2018 Surgery type: sleeve Start weight at [facility]: 310.7 Weight today: pt declined  TANITA  BODY COMP RESULTS  Pt declined   BMI (kg/m^2)    Fat Mass (lbs)    Fat Free Mass (lbs)    Total Body Water (lbs)    The following the learning objectives were met by the patient during this course:  Identifies Phase 3A (Soft, High Proteins) Dietary Goals and will begin from 2 weeks post-operatively to 2 months post-operatively  Identifies appropriate sources of fluids and proteins   States protein recommendations and appropriate sources post-operatively  Identifies the need for appropriate texture modifications, mastication, and bite sizes when consuming solids  Identifies appropriate multivitamin and calcium sources post-operatively  Describes the need for physical activity post-operatively and will follow MD recommendations  States when to call healthcare provider regarding medication questions or post-operative complications  Handouts given during class include:  Phase 3A: Soft, High Protein Diet Handout  Follow-Up Plan: Patient will follow-up at [facility] in 6 weeks for 2 month post-op nutrition visit for diet advancement per MD.

## 2018-05-31 ENCOUNTER — Ambulatory Visit: Payer: 59 | Admitting: Certified Nurse Midwife

## 2018-05-31 ENCOUNTER — Encounter: Payer: Self-pay | Admitting: Certified Nurse Midwife

## 2018-05-31 ENCOUNTER — Other Ambulatory Visit: Payer: Self-pay

## 2018-05-31 VITALS — BP 104/64 | HR 68 | Resp 16 | Wt 285.0 lb

## 2018-05-31 DIAGNOSIS — Z01812 Encounter for preprocedural laboratory examination: Secondary | ICD-10-CM

## 2018-05-31 DIAGNOSIS — R87810 Cervical high risk human papillomavirus (HPV) DNA test positive: Secondary | ICD-10-CM

## 2018-05-31 LAB — POCT URINE PREGNANCY: PREG TEST UR: NEGATIVE

## 2018-05-31 NOTE — Addendum Note (Signed)
Addended by: Verner Chol on: 05/31/2018 04:09 PM   Modules accepted: Orders

## 2018-05-31 NOTE — Progress Notes (Signed)
03-27-18 neg HPV HR +, 16,18/45 neg 02-09-17 neg HPV HR +, 16.18/45 neg upt-neg

## 2018-05-31 NOTE — Progress Notes (Addendum)
Patient ID: Jessica Castro, female   DOB: Jul 26, 1986, 32 y.o.   MRN: [id number]  Chief Complaint  Patient presents with  . Colposcopy    //jj    HPI Jessica Castro is a 32 y.o.  White g0p0 female here for colposcopy exam for +HPV 2018, 2019 with 16,18, 45 not detected. Denies vaginal bleeding or pelvic pain. Patient 2 weeks out from Gastric Sleeve surgery, doing well. Not sexually active since 7/19. Contraception OCP.    Indications: Pap smear on 8/272019 showed: + HPV only with 16, 18, 45 not detected. Previous colposcopy: none   Pap smear.02/09/17 +HPV 16, 18, 45 not detected  Past Medical History:  Diagnosis Date  . Abnormal Pap smear of cervix    2018 & 2019 neg HPV HR+, 16,18/45 neg  . Allergic rhinitis    Allergy- shots with Dr Sharyn Lull  . Allergy   . Amenorrhea   . Anxiety    has a history of panic disorder  . Asthma   . Bipolar 2 disorder (HCC)   . Depression    treated with anxiety- current  counseling   . GERD (gastroesophageal reflux disease)   . Herniated disc    back  . History of bronchitis   . History of chicken pox    childhood  . History of headache   . History of migraines    last was 1 month ago   . Hypertension   . Menorrhagia   . PCOS (polycystic ovarian syndrome)    takes Metformin     Past Surgical History:  Procedure Laterality Date  . LAPAROSCOPIC GASTRIC SLEEVE RESECTION N/A 05/14/2018   Procedure: LAPAROSCOPIC GASTRIC SLEEVE RESECTION WITH UPPER ENDO AND ERAS PATHWAY;  Surgeon: Luretha Murphy, MD;  Location: [facility];  Service: General;  Laterality: N/A;  . WISDOM TOOTH EXTRACTION  2003    Family History  Problem Relation Age of Onset  . Alcohol abuse Mother   . Hyperlipidemia Mother   . Heart disease Mother   . Anxiety disorder Mother   . Hyperlipidemia Father   . Diabetes Father   . Anxiety disorder Sister   . Hyperlipidemia Maternal Grandmother   . Alzheimer's disease Maternal Grandmother   . Pancreatic cancer Paternal Grandfather   .  Stroke Maternal Grandfather   . Alcohol abuse Maternal Grandfather     Social History Social History   Tobacco Use  . Smoking status: Never Smoker  . Smokeless tobacco: Never Used  Substance Use Topics  . Alcohol use: Not Currently  . Drug use: Yes    Types: Marijuana    Allergies  Allergen Reactions  . Junel Fe 1-20 [Norethin Ace-Eth Estrad-Fe]     Hives with this brand. microgestin is fine  . Latex     Causes eczema flares   . Peanuts [Peanut Oil] Hives    Tree nuts  . Adhesive [Tape] Rash    Current Outpatient Medications  Medication Sig Dispense Refill  . cetirizine (ZYRTEC) 10 MG tablet Take 10 mg by mouth daily as needed for allergies.    Marland Kitchen escitalopram (LEXAPRO) 5 MG tablet Take 5 mg by mouth at bedtime.  2  . fluticasone (FLONASE) 50 MCG/ACT nasal spray Place 1 spray into both nostrils daily as needed for allergies or rhinitis.    Marland Kitchen lamoTRIgine (LAMICTAL) 150 MG tablet Take 150 mg by mouth at bedtime.   1  . metFORMIN (GLUCOPHAGE-XR) 500 MG 24 hr tablet Take 500 mg by mouth at bedtime.   3  .  Multiple Vitamins-Minerals (BARIATRIC MULTIVITAMINS/IRON PO) Take 1 tablet by mouth daily.    . pantoprazole (PROTONIX) 40 MG tablet Take 1 tablet (40 mg total) by mouth daily. 90 tablet 0  . propranolol (INDERAL) 20 MG tablet Take 20 mg by mouth every 6 (six) hours as needed for anxiety.  2   No current facility-administered medications for this visit.     Review of Systems Review of Systems  Constitutional: Negative.   HENT: Negative.   Respiratory: Negative.   Cardiovascular: Negative.   Gastrointestinal: Negative for abdominal distention, abdominal pain, diarrhea and nausea.       Soreness from surgery only  Endocrine: Negative.   Genitourinary: Negative.  Negative for genital sores, pelvic pain, vaginal bleeding, vaginal discharge and vaginal pain.  Musculoskeletal: Negative.   Skin: Negative.   Allergic/Immunologic: Negative.   Neurological: Negative.     Hematological: Negative.   Psychiatric/Behavioral: Negative.     Blood pressure 104/64, pulse 68, resp. rate 16, weight 285 lb (129.3 kg), last menstrual period 05/07/2018.  Physical Exam Physical Exam  Constitutional: She is oriented to person, place, and time. She appears well-developed and well-nourished.  Genitourinary: Vagina normal. Pelvic exam was performed with patient supine. There is no rash, tenderness or lesion on the right labia. There is no rash, tenderness or lesion on the left labia. Cervix exhibits no motion tenderness, no discharge and no friability. No tenderness or bleeding in the vagina.    Genitourinary Comments: Mucous from cervix only  Lymphadenopathy: No inguinal adenopathy noted on the right or left side.  Neurological: She is alert and oriented to person, place, and time.  Skin: Skin is warm and dry.    Data Reviewed Reviewed pap smear findings with patient and HPV detected x 2 years with 16,10,96 not detected. Questions addressed.  Assessment  +HPV with negative pap smear, 2018,2019 Recent gastric sleeve procedure for weight loss, doing well Not sexually active OCP use for cycle control Procedure Details  The risks and benefits of the procedure and Written informed consent obtained.  Speculum placed in vagina and excellent visualization of cervix achieved after changing speculum x 3 due to comfort level for patient and visualization for provider, cervix swabbed x 3 with saline and  acetic acid solution. Cervix viewed with 3.75, 7.5, 15# and green filter. No abnormal areas noted. Lugol's applied stain taken throughout, no high minimal staining noted. ECC obtained. Monsel's applied to cervical os. No active bleeding on removal of speculum. Patient tolerated procedure well. Instructions verbal and written given to patient and verbalized understanding. Escorted to check out with no concerns.  Specimens: one  Complications: none.     Plan    Specimens  labelled and sent to Pathology. Patient will be called with results once reviewed. Can resume OCP when she starts her period as discussed. Reviewed pathology ECC showed benign endocervical glandular epithelium,no atypia, dysplasia or malignancy. Patient to be notified and need for repeat pap smear in one year. 08 recall   Rv prn      Leota Sauers 05/31/2018, 12:09 PM

## 2018-05-31 NOTE — Patient Instructions (Signed)

## 2018-06-04 ENCOUNTER — Telehealth: Payer: Self-pay | Admitting: Dietician

## 2018-06-04 NOTE — Telephone Encounter (Signed)
Dietitian called pt to assess pts diet progression from Liquid to Solid phase. Pt states she is unavailable to take a call at the time and will need to call back.

## 2018-07-10 ENCOUNTER — Encounter: Payer: 59 | Attending: Surgery | Admitting: Skilled Nursing Facility1

## 2018-07-10 ENCOUNTER — Encounter: Payer: Self-pay | Admitting: Skilled Nursing Facility1

## 2018-07-10 DIAGNOSIS — Z713 Dietary counseling and surveillance: Secondary | ICD-10-CM | POA: Insufficient documentation

## 2018-07-10 DIAGNOSIS — E669 Obesity, unspecified: Secondary | ICD-10-CM

## 2018-07-10 NOTE — Patient Instructions (Signed)
-  Continue to aim for a minimum of 64 fluid ounces 7 days a week with at least 30 ounces being plain water  -Eat non-starchy vegetables 2 times a day 7 days a week  -Start out with soft cooked vegetables today and tomorrow; if tolerated begin to eat raw vegetables or cooked including salads  -Eat your 3 ounces of protein first then start in on your non-starchy vegetables; once you understand how much of your meal leads to satisfaction and not full while still eating 3 ounces of protein and non-starchy vegetables you can eat them in any order   -Continue to aim for 30 minutes of activity at least 5 times a week  

## 2018-07-10 NOTE — Progress Notes (Signed)
Follow-up visit:  8 Weeks Post-Operative Sleeve Surgery Primary concerns today: Post-operative Bariatric Surgery Nutrition Management.  Pt states she is now taking vyvanse. Pt states she does really well when she has boundaries. Pt states every now and then she has a craving for soda but realized when she waits the craving always passes. Pt states she did try a  Little of everything for Thanksgiving. Pt states pico de gallo, tomatoes, onions gives her heart burn. Pt states her energy level has picked up. Pt states she has a bowel movement about 2-3 times a week which she feels is her normal. Pt states she drinks protein shakes because she likes them.  Pt has added in vegetables. Pt states she is newly obsessed with starbucks shakin passionfruit tea with splenda. Pt states she sets 4 goals for herself every year her new one will be working out: [place]. Pt states she has had reflux 3 times after surgery. Pt states she has lost 40 pounds since surgery. Pt states her weight does not bother her, it is just used as a measuring tool along with all of the other information such as clothing fit.   Surgery date: 05/14/2018 Surgery type[facility] Weight today: pt declined  24-hr recall: B (AM): eggs or Malawiturkey sausage  Snk (AM):  L (PM): chick file grilled nuggets or wendys chili or cheese stick and jerky or tuna or salad Snk (PM): sometimes a handful of peanuts D (PM): meat- steak or shrimp Snk (PM): sometimes sugar free popcicle   Fluid intake: sugar free passionfruit tea and water: 60 ounces Estimated total protein intake: 60+  Medications: see list Supplementation: procare and calcium  Using straws: no Drinking while eating: no Having you been chewing well: yes Chewing/swallowing difficulties: no Changes in vision: no Changes to mood/headaches: no Hair loss/Cahnges to skin/Changes to nails: no Any difficulty focusing or concentrating: no Sweating:  no Dizziness/Lightheaded: no Palpitations: no Carbonated beverages: no N/V/D/C/GAS: no Abdominal Pain: no Dumping syndrome: no  Recent physical activity:  ADL's  Progress Towards Goal(s):  In progress.  Handouts given during visit include:  Non starchy vegetable + protein     Intervention:  Nutrition counseling. Pts diet was advanced to the next phase now including non starchy vegetables. Due to the bodies need for essential vitamins, minerals, and fats the pt was educated on the need to consume a certain amount of calories as well as certain nutrients daily. Pt was educated on the need for daily physical activity and to reach a goal of at least 150 minutes of moderate to vigorous physical activity as directed by their physician due to such benefits as increased musculature and improved lab values.   Goals: -Continue to aim for a minimum of 64 fluid ounces 7 days a week with at least 30 ounces being plain water -Eat non-starchy vegetables 2 times a day 7 days a week -Start out with soft cooked vegetables today and tomorrow; if tolerated begin to eat raw vegetables or cooked including salads -Eat your 3 ounces of protein first then start in on your non-starchy vegetables; once you understand how much of your meal leads to satisfaction and not full while still eating 3 ounces of protein and non-starchy vegetables you can eat them in any order  -Continue to aim for 30 minutes of activity at least 5 times a week  Teaching Method Utilized:  Visual Auditory Hands on  Barriers to learning/adherence to lifestyle change: none stated  Demonstrated degree of understanding via:  Teach Back   Monitoring/Evaluation:  Dietary intake, exercise, and body weight. Follow up in 2 months

## 2018-07-12 ENCOUNTER — Ambulatory Visit: Payer: 59 | Admitting: Certified Nurse Midwife

## 2018-07-12 ENCOUNTER — Other Ambulatory Visit: Payer: Self-pay

## 2018-07-12 ENCOUNTER — Encounter: Payer: Self-pay | Admitting: Certified Nurse Midwife

## 2018-07-12 VITALS — BP 118/72 | HR 70 | Resp 16 | Wt 263.0 lb

## 2018-07-12 DIAGNOSIS — Z9884 Bariatric surgery status: Secondary | ICD-10-CM

## 2018-07-12 DIAGNOSIS — Z113 Encounter for screening for infections with a predominantly sexual mode of transmission: Secondary | ICD-10-CM | POA: Diagnosis not present

## 2018-07-12 DIAGNOSIS — Z789 Other specified health status: Secondary | ICD-10-CM

## 2018-07-12 DIAGNOSIS — Z30015 Encounter for initial prescription of vaginal ring hormonal contraceptive: Secondary | ICD-10-CM | POA: Diagnosis not present

## 2018-07-12 DIAGNOSIS — N898 Other specified noninflammatory disorders of vagina: Secondary | ICD-10-CM

## 2018-07-12 DIAGNOSIS — Z01419 Encounter for gynecological examination (general) (routine) without abnormal findings: Secondary | ICD-10-CM

## 2018-07-12 LAB — POCT URINE PREGNANCY: Preg Test, Ur: NEGATIVE

## 2018-07-12 MED ORDER — ETONOGESTREL-ETHINYL ESTRADIOL 0.12-0.015 MG/24HR VA RING: VAGINAL_RING | VAGINAL | 9 refills | Status: DC

## 2018-07-12 NOTE — Progress Notes (Signed)
32 y.o. Single Caucasian female [id number] here with complaint of vaginal symptoms of itching, burning, and increase yellow discharge for the past  6 days. Describes discharge as non odorous. .Denies new personal products or vaginal dryness. Condom use for contraception and condom stayed inside and took Plan B. Due to gastric sleeve surgery worried about absorption. Has STD concerns and requests STD screening.Marland Kitchen. Urinary symptoms none . Patient has used Nuvaring before and would like to use again once menses returns. No other health issues today.  Review of Systems  Constitutional: Negative.   HENT: Negative.   Eyes: Negative.   Respiratory: Negative.   Cardiovascular: Negative.   Gastrointestinal: Negative.   Genitourinary: Negative.        Vaginal discharge watery  Musculoskeletal: Negative.   Skin: Negative.   Neurological: Negative.   Endo/Heme/Allergies: Negative.   Psychiatric/Behavioral: Negative.     O:Healthy female WDWN Affect: normal, orientation x 3  Exam:Skin warm and dry Abdomen:soft, non tender, no masses  inguinal Lymph nodes: no enlargement or tenderness Pelvic exam: External genital: normal female BUS: negative Vagina: white watery discharge noted. Affirm taken Cervix: normal, non tender, no CMT Uterus: normal, non tender Adnexa:normal, non tender, no masses or fullness noted   A:Normal pelvic exam R/O vaginal infection STD screening Contraception desired Nuvaring again Aex current Gastric sleeve surgery with 54 pounds weight loss so far   P:Discussed findings of normal pelvic exam and discharge noted. Will treat once results in if possible, use to condom issue. Marland Kitchen. Discussed Aveeno or baking soda sitz bath for comfort.  Lab: Affirm, Gc/chlamydia Discussed risks/benefits of Nuvaring and expectations with cycle. She will need to wait for menses and make sure negative pregnancy test when menses occurs prior to starting Nuvaring. Patient agreeable. Rx Nuvaring see  order with instructions. Warning signs given and need to advise if issues. If no period and positive UPT will advise.  Rv prn

## 2018-07-13 LAB — VAGINITIS/VAGINOSIS, DNA PROBE
CANDIDA SPECIES: NEGATIVE
GARDNERELLA VAGINALIS: NEGATIVE
Trichomonas vaginosis: NEGATIVE

## 2018-07-14 LAB — GC/CHLAMYDIA PROBE AMP
Chlamydia trachomatis, NAA: NEGATIVE
NEISSERIA GONORRHOEAE BY PCR: NEGATIVE

## 2018-08-27 DIAGNOSIS — E7800 Pure hypercholesterolemia, unspecified: Secondary | ICD-10-CM | POA: Diagnosis not present

## 2018-08-27 DIAGNOSIS — E282 Polycystic ovarian syndrome: Secondary | ICD-10-CM | POA: Diagnosis not present

## 2018-08-27 DIAGNOSIS — Z9884 Bariatric surgery status: Secondary | ICD-10-CM | POA: Diagnosis not present

## 2018-09-11 ENCOUNTER — Encounter: Payer: 59 | Attending: Surgery | Admitting: Skilled Nursing Facility1

## 2018-09-11 DIAGNOSIS — Z713 Dietary counseling and surveillance: Secondary | ICD-10-CM | POA: Diagnosis not present

## 2018-09-11 DIAGNOSIS — E669 Obesity, unspecified: Secondary | ICD-10-CM

## 2018-09-11 NOTE — Progress Notes (Signed)
Follow-up visit:  Post-Operative Sleeve Surgery Primary concerns today: Post-operative Bariatric Surgery Nutrition Management.   Pt states her weight does not bother her, it is just used as a measuring tool along with all of the other information such as clothing fit.  Pt states she just found out she is not supposed to take folic acid with lamictal. Pt states she has been written out of work due to mental health stress and has found a new job with [employer]. Pt state she is still taking protonix and is thinking about not taking it anymore. Pt states she has lost 80 pounds. Pt states in the past 2 week her stress has gotten better. Pt states she started the gym and yoga. Pt states she was going through a time of not eating due to stress with work. Pt states she ahs learned if she pays too much attention to her bowels movements she wll get more constipated due to stress. Pt states the only craving she has is soda but has not drunk any. Pt states she sees she does not see the same emotional repsonse from food when she binges which she is very excited about. Pt states she sometimes eats half an apple with peanut butter before the gym.   Surgery date: 05/14/2018 Surgery type[facility] Weight today: pt declined  24-hr recall: B (AM): eggs or Malawiturkey sausage or greek yogurt or egg with vegetables Snk (AM):  L (PM): chick file grilled nuggets or wendys chili or cheese stick and jerky or tuna or salad or beef soup  Snk (PM): sometimes a handful of peanuts D (PM): meat- steak or shrimp or Malawiturkey sandwich  Snk (PM): sometimes sugar free popcicle   Fluid intake: sugar free passionfruit tea, no sugar juice, water with flavorings, coffee, hot tea and water: 60 ounces Estimated total protein intake: 60+  Medications: see list Supplementation: calcium   Using straws: no Drinking while eating: no Having you been chewing well: yes Chewing/swallowing difficulties: no Changes in vision:  no Changes to mood/headaches: no Hair loss/Cahnges to skin/Changes to nails: hair loss Any difficulty focusing or concentrating: no Sweating: no Dizziness/Lightheaded: no Palpitations: no Carbonated beverages: no N/V/D/C/GAS: constipation  Abdominal Pain: no Dumping syndrome: no  Recent physical activity: (last 2 weeks)  Yoga 1 day a week with her mom and gym: cardio and some weights 3 days a week  Progress Towards Goal(s):  In progress.  Handouts given during visit include:  Non starchy vegetable + protein     Intervention:  Nutrition counseling. Pts diet was advanced to the next phase now including non starchy vegetables. Due to the bodies need for essential vitamins, minerals, and fats the pt was educated on the need to consume a certain amount of calories as well as certain nutrients daily. Pt was educated on the need for daily physical activity and to reach a goal of at least 150 minutes of moderate to vigorous physical activity as directed by their physician due to such benefits as increased musculature and improved lab values.   Goals: -Aim for a minium of 70 fluid ounces a day -Aim for a minimum of 2 times a day of non starchy vegetables  -Get back to taking 3 calcium a day -Try kefir in the yogurt aisle   Teaching Method Utilized:  Visual Auditory Hands on  Barriers to learning/adherence to lifestyle change: none stated   Demonstrated degree of understanding via:  Teach Back   Monitoring/Evaluation:  Dietary  intake, exercise, and body weight. Follow up in 3 months

## 2018-09-11 NOTE — Patient Instructions (Addendum)
-  Aim for a minium of 70 fluid ounces a day  -Aim for a minimum of 2 times a day of non starchy vegetables   -Get back to taking 3 calcium a day  -Try kefir in the yogurt aisle

## 2018-09-13 ENCOUNTER — Telehealth: Payer: Self-pay | Admitting: Certified Nurse Midwife

## 2018-09-13 MED ORDER — NORETHIN ACE-ETH ESTRAD-FE 1-20 MG-MCG PO TABS: 1.0000 | ORAL_TABLET | Freq: Every day | ORAL | 1 refills | Status: DC

## 2018-09-13 NOTE — Telephone Encounter (Signed)
Message   Hi Debbie!     I decided that I don't want to keep using Nuva ring. My period has been heavy and I'm having bad cramps. I also have been having some sexual side effects. I had that the last time I used Nuva ring too, but at the time I attributed it to being 340 lbs. I know my body is going through a ton of changes right now which could impact that (I'm down 80 lbs!!). I just started to lose some of my hair too. But I've always done really well on the pill. The person I'm with right now had a vasectomy bc he has children so that's not a concern. I have a pack from before that I hadn't used. I'm on my period right now so if you're okay with it I just need to know when I can start taking it?    Thanks Debbie!!    PS. If you're able to respond before tomorrow, do you need to reorder the HPV vaccines for me? I haven't gone yet bc I always forget when I'm on my period but I have time to go tomorrow.

## 2018-09-13 NOTE — Telephone Encounter (Signed)
Ok to restart OCP Blisovi and start HPV vaccine, will need to precert and be on her period to give vaccine if no contraception.

## 2018-09-13 NOTE — Telephone Encounter (Signed)
Spoke with patient.  On cycle now. Started 09/11/2018. Wants HPV Vaccine. She states her Mother will pay cash for vaccine if not covered by insurance.  She wants tto restart pills, thinks she has blisovi at home. Advised can start today or Sunday. Vasectomy for contraception otherwise or condoms.  Rx for [facility] until next Annual exam.   Encounter closed.

## 2018-09-13 NOTE — Telephone Encounter (Signed)
Routing message to [employer] CNM.   Pt requesting to DC Nuvaring and restart her prior pills (Blisovi)?  Also wants to plan HPV vaccine. Okay to change back to OCP?  Hx of Bariatric Surgery.

## 2018-09-13 NOTE — Progress Notes (Deleted)
Patient in today for 1st Gardasil injection.   Contraception: OCP LMP: 09/11/18 Last AEX: 02/09/17 with DL  Injection given in ***. Patient tolerated shot well.   Patient informed next injection due in about 2 months.  Advised patient, if not on birth control, to return for next injection with cycle.   Routed to provider for final review.  Encounter closed.

## 2018-09-14 ENCOUNTER — Ambulatory Visit: Payer: Self-pay

## 2018-09-27 ENCOUNTER — Telehealth: Payer: Self-pay | Admitting: Skilled Nursing Facility1

## 2018-09-27 NOTE — Telephone Encounter (Signed)
*  Caution - External email - see footer for warnings*  Hi Alexis!  1) I tried the kefir. Kind of reminds me of when my grandma tried to make [place] eat unsweetened yogurt for dessert BUT I have pooped EVERY. DAY. Very good suggestion!!  2) have you been able to find a vitamin for me with no folic acid??  Thank you!!  Alayza Haith  Sent from my iPhone WARNING: This email originated outside of [employer]. Even if this looks like a FedEx, it is not. Do not provide your username, password, or any other personal information in response to this or any other email. [facility] will never ask you for your username or password via email. DO NOT CLICK links or attachments unless you are positive the content is safe. If in doubt about the safety of this message, select the [employer] Phishing button, which forwards to IT Security.          [employer]!! But I am so sorry it does not bring up the nicest of memories 1  Does the multivitamin have to have no form of folic acid or can it have folic acid in a different form such as  dihydrofolate (DHF), tetrahydrofolate (THF), 5, 10-methylenetetrahydrofolate (5, 10-MTHF), and 5-methyltetrahydrofolate (5-MTHF)?     Thank You, Serena Colonel MS, RD, CSOWM, LDN [place]  [facility]

## 2018-10-17 DIAGNOSIS — Z79899 Other long term (current) drug therapy: Secondary | ICD-10-CM | POA: Diagnosis not present

## 2018-10-17 DIAGNOSIS — E559 Vitamin D deficiency, unspecified: Secondary | ICD-10-CM | POA: Diagnosis not present

## 2018-11-30 DIAGNOSIS — Z79899 Other long term (current) drug therapy: Secondary | ICD-10-CM | POA: Diagnosis not present

## 2018-12-10 ENCOUNTER — Ambulatory Visit: Payer: Self-pay | Admitting: Skilled Nursing Facility1

## 2018-12-10 ENCOUNTER — Telehealth: Payer: Self-pay | Admitting: Skilled Nursing Facility1

## 2018-12-10 NOTE — Telephone Encounter (Signed)
Jessica Castro,  I am sorry to hear you lost your insurance, I believe the no insurance fee would be 60 dollars for the appointment.  Let me know if we need to cancel your appointment or keep it.   I looked through my notes and could not find where I recommended a scale but if you are talking a food scale anything in your budget is fine and if you are talking people scale, none is best 12  Fasting is another fad diet so I recommend you not do it due to the implications diets have on our relationship with food and stalling long-term success. It would also be inappropriate for you specifically due to you having the surgery it would severely limit your ability to meet your nutrient needs.     -----Original Message----- From: Jessica [email or web address]>  Sent: Monday, Dec 10, 2018 10:11 AM To: Serena Colonel [email or web address]> Subject: [External Email]Question/FYI  *Caution - External email - see footer for warnings*  Good morning, Jessica Castro  I think we were supposed to have an appt. or there is one coming up.  I'm not really sure what to do because I recently lost my insurance and am unable to afford the cost as I think it's probably high. I do have two other questions -  1) What was the scale you had recommended that we buy?  2) Is intermittent fasting a legit thing or is that just another fad?  Thanks!  Jessica Castro  Sent from my iPad WARNING: This email originated outside of [facility]. Even if this looks like a FedEx, it is not. Do not provide your username, password, or any other personal information in response to this or any other email. [facility] will never ask you for your username or password via email. DO NOT CLICK links or attachments unless you are positive the content is safe. If in doubt about the safety of this message, select the [employer] Report Phishing button, which forwards to IT Security.

## 2019-01-17 IMAGING — RF DG UGI W/ KUB
11 series · 12 of 12 positions shown · non-contrast
Comparison: None.

CLINICAL DATA: Morbid obesity.  Preoperative exam.

EXAM:
UPPER GI SERIES WITH KUB
TECHNIQUE: After obtaining a scout radiograph a routine upper GI series was
performed using thin density barium.
FLUOROSCOPY TIME:  Fluoroscopy Time:  1.0 minutes
Radiation Exposure Index (if provided by the fluoroscopic device):
40.1 mGy

[Series 1: t abdomen supine · 0.15mm/px · 1 of 1 slices shown]
[im 1/1]
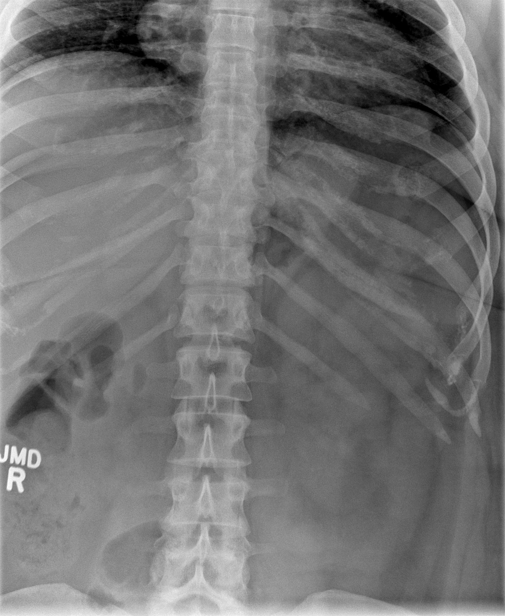

[Series 2: fluoro_barium 2fps_bw · 0.18mm/px · 2 of 2 frames shown (1 of 10)]
[frame 1/2]
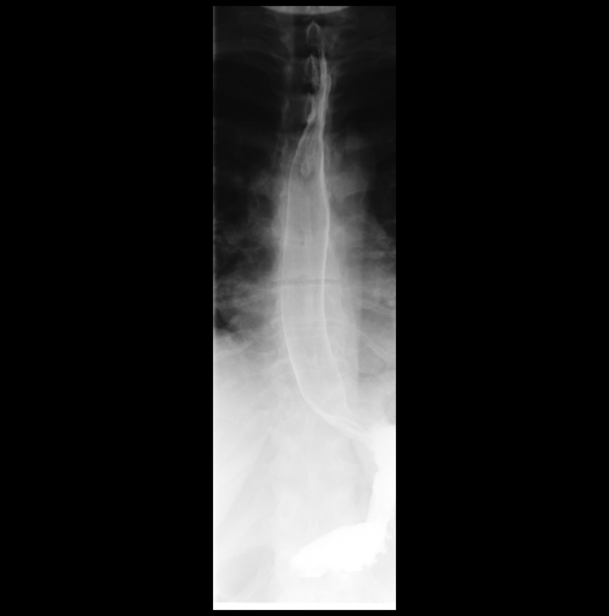
[frame 2/2]
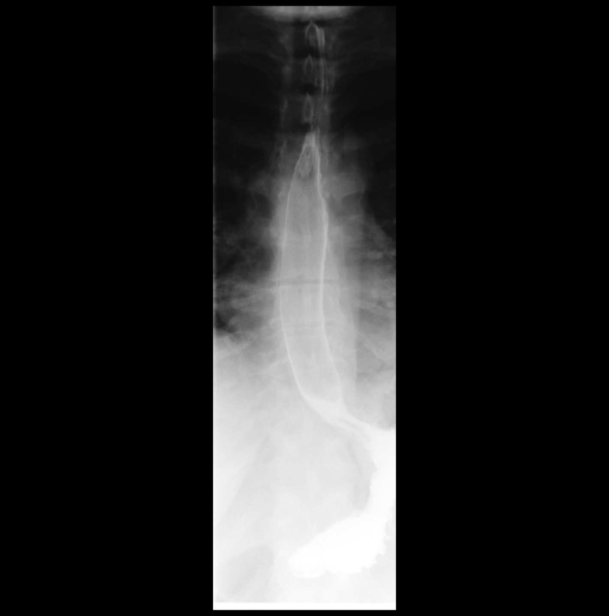

[Series 3: fluoro_barium 2fps_bw · 0.18mm/px · 1 of 1 slices shown (2 of 10)]
[im 1/1]
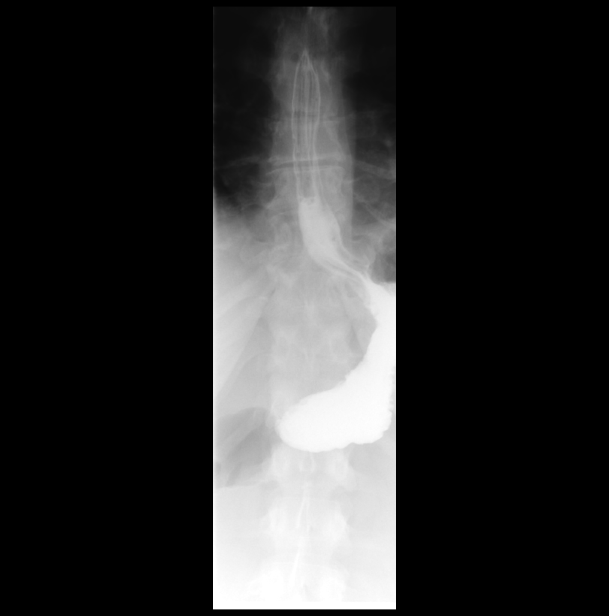

[Series 4: fluoro_barium 2fps_bw · 0.19mm/px · 1 of 1 slices shown (3 of 10)]
[im 1/1]
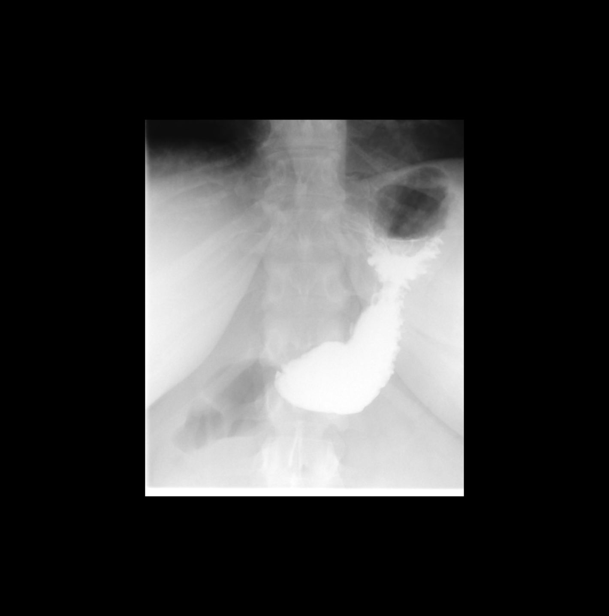

[Series 5: fluoro_barium 2fps_bw · 0.18mm/px · 1 of 1 slices shown (4 of 10)]
[im 1/1]
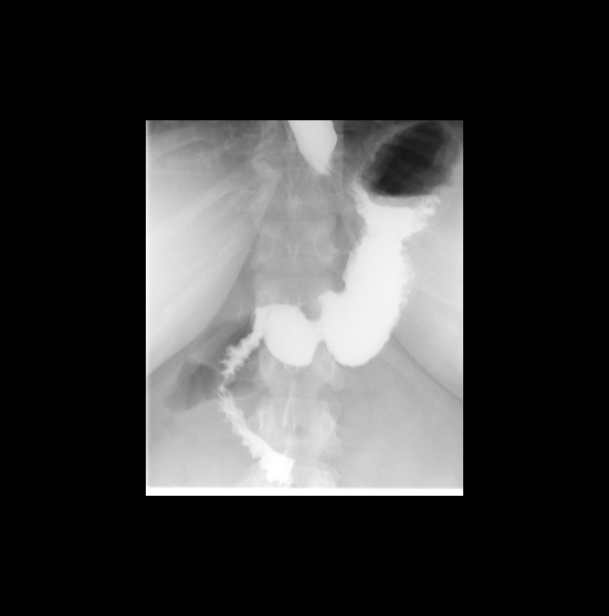

[Series 6: fluoro_barium 2fps_bw · 0.18mm/px · 1 of 1 slices shown (5 of 10)]
[im 1/1]
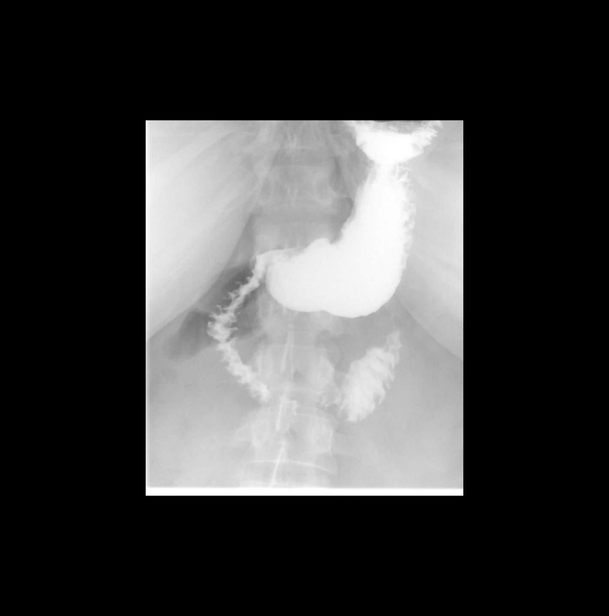

[Series 7: fluoro_barium 2fps_bw · 0.20mm/px · 1 of 1 slices shown (6 of 10)]
[im 1/1]
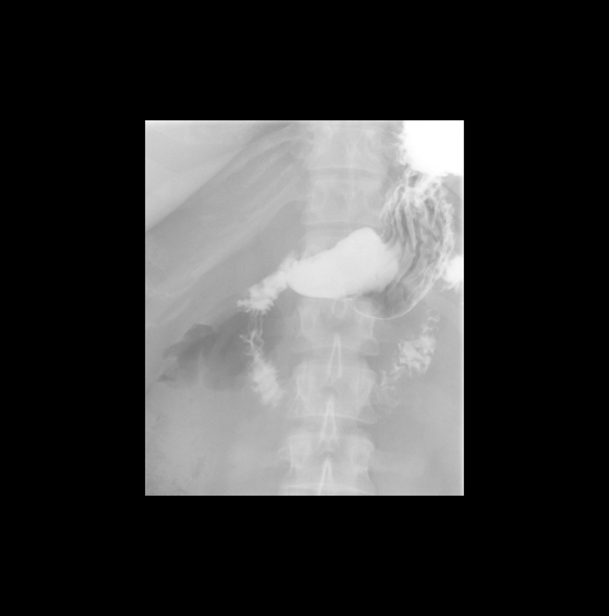

[Series 8: fluoro_barium 2fps_bw · 0.20mm/px · 1 of 1 slices shown (7 of 10)]
[im 1/1]
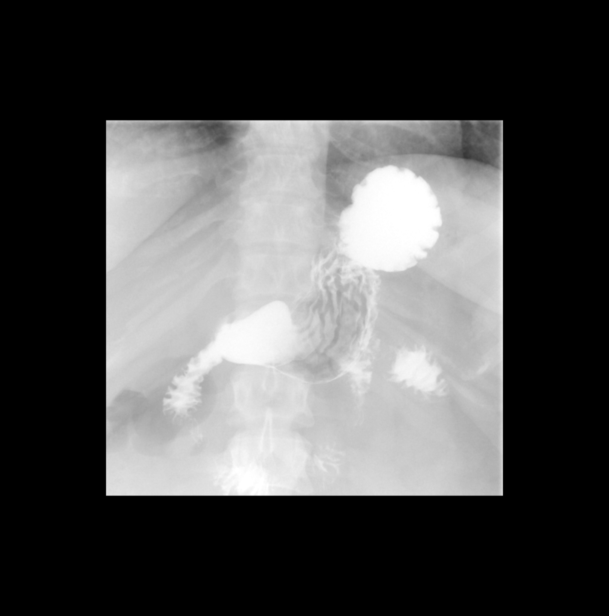

[Series 9: fluoro_barium 2fps_bw · 0.19mm/px · 1 of 1 slices shown (8 of 10)]
[im 1/1]
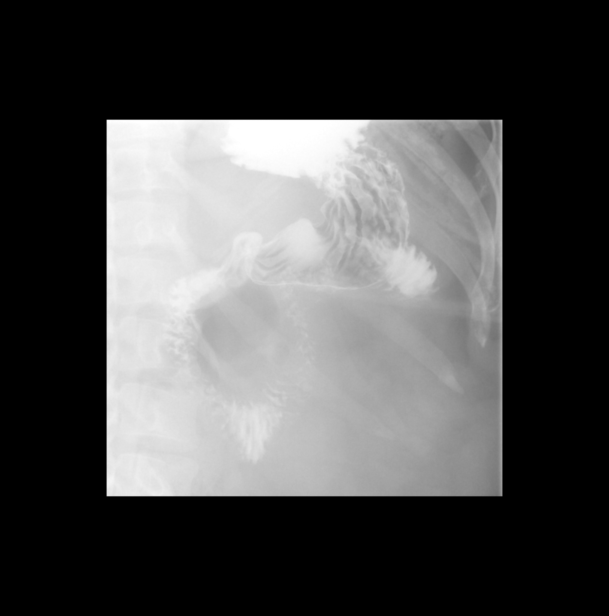

[Series 10: fluoro_barium 2fps_bw · 0.19mm/px · 1 of 1 slices shown (9 of 10)]
[im 1/1]
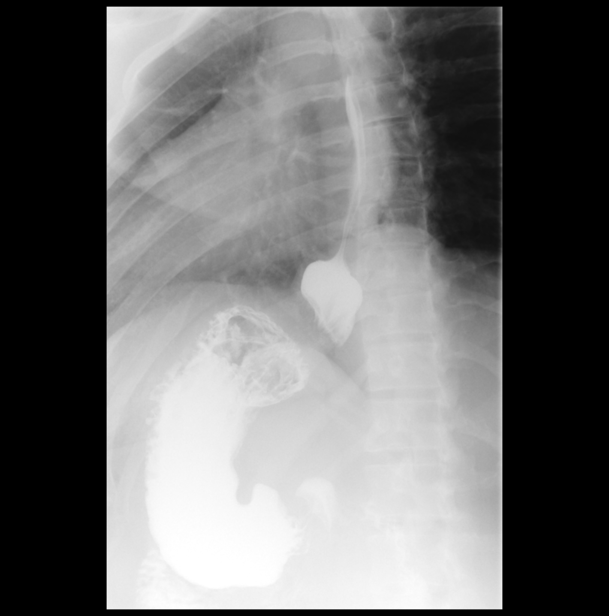

[Series 11: fluoro_barium 2fps_bw · 0.19mm/px · 1 of 1 slices shown (10 of 10)]
[im 1/1]
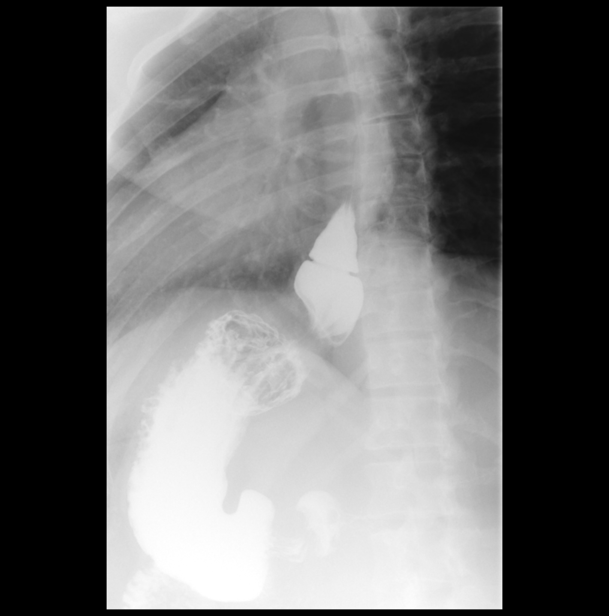

[12 of 12 positions shown; findings below may reference images not displayed]

FINDINGS: The KUB is normal. The mucosa and motility of the esophagus is
normal.

There is a 4 cm sliding hiatal hernia only demonstrated with
Valsalva maneuver.

The fundus, body, and antrum of the stomach are otherwise normal.
Pylorus and duodenal bulb and C-loop are normal.
IMPRESSION: 4 cm sliding hiatal hernia.  Otherwise, normal exam.

## 2019-02-05 ENCOUNTER — Other Ambulatory Visit: Payer: Self-pay

## 2019-02-05 NOTE — Progress Notes (Addendum)
33 y.o. Single Caucasian female G0P0000 here with complaint of vaginal symptoms of itching, burning, and increase discharge. Describes discharge as watery yellow with slight odor. Treated self with OTC Monistat Ovules for 3 days. No change and felt worse. Was also treated with Diflucan 3 weeks ago from PCP and felt better, but had sexual activity with new partner. Also has been swimming recently. New partner, but plans STD screening [facility]. Onset of symptoms 3 weeks ago. Denies new personal products.. Urinary symptoms none . Contraception is OCP, no missed pills or headaches with use. No other health issues today.  Review of Systems  Constitutional: Negative.   HENT: Negative.   Eyes: Negative.   Respiratory: Negative.   Cardiovascular: Negative.   Gastrointestinal: Negative.   Genitourinary:       Vaginal itching, irritation, yellow discharge  Musculoskeletal: Negative.   Skin: Negative.   Neurological: Negative.   Endo/Heme/Allergies: Negative.   Psychiatric/Behavioral: Negative.     O:Healthy female WDWN Affect: normal, orientation x 3  Exam: Skin : warm, dry Abdomen: soft, non tender Lymph node: no enlargement or tenderness Pelvic exam: External genital: normal female, no lesions or redness, non tender BUS: negative Vagina: watery milky slight odorous discharge noted.  Affirm taken Cervix: normal, non tender, no CMT Uterus: normal, non tender Adnexa:normal, non tender, no masses or fullness noted   A:Normal pelvic exam R/O vaginal infection ? BV Contraception OCP STD testing planned at health department   P:Discussed findings of vaginal discharge and ? Etiology of BV.Marland Kitchen Discussed Aveeno or baking soda sitz bath for comfort. Avoid moist clothes or pads for extended period of time. If working out in gym clothes or swim suits for long periods of time change underwear or bottoms of swimsuit if possible.  Be sure to keep appointment with health department. Lab:  Affirm  Rv prn

## 2019-02-06 ENCOUNTER — Other Ambulatory Visit: Payer: Self-pay

## 2019-02-06 ENCOUNTER — Ambulatory Visit (INDEPENDENT_AMBULATORY_CARE_PROVIDER_SITE_OTHER): Payer: Self-pay | Admitting: Certified Nurse Midwife

## 2019-02-06 ENCOUNTER — Encounter: Payer: Self-pay | Admitting: Certified Nurse Midwife

## 2019-02-06 VITALS — BP 110/70 | HR 70 | Temp 97.9°F | Resp 16 | Wt 236.0 lb

## 2019-02-06 DIAGNOSIS — N949 Unspecified condition associated with female genital organs and menstrual cycle: Secondary | ICD-10-CM

## 2019-02-06 DIAGNOSIS — N898 Other specified noninflammatory disorders of vagina: Secondary | ICD-10-CM

## 2019-02-06 DIAGNOSIS — F3181 Bipolar II disorder: Secondary | ICD-10-CM | POA: Insufficient documentation

## 2019-02-06 DIAGNOSIS — N9489 Other specified conditions associated with female genital organs and menstrual cycle: Secondary | ICD-10-CM

## 2019-02-07 LAB — VAGINITIS/VAGINOSIS, DNA PROBE
Candida Species: NEGATIVE
Gardnerella vaginalis: POSITIVE — AB
Trichomonas vaginosis: POSITIVE — AB

## 2019-02-08 ENCOUNTER — Telehealth: Payer: Self-pay

## 2019-02-08 MED ORDER — METRONIDAZOLE 500 MG PO TABS: 500.0000 mg | ORAL_TABLET | Freq: Two times a day (BID) | ORAL | 0 refills | Status: DC

## 2019-02-08 NOTE — Telephone Encounter (Signed)
Notes recorded by Regina Eck, CNM on 02/08/2019 at 7:47 AM EDT  Notify patient her vaginal screening was negative for yeast. Positive for BV and Trichomonas( which is an STD). She will need Rx Flagyl 500 mg bid x 7 days avoid alcohol use during treatment. Needs TOC at 4-6 weeks for Trichomonas. Partner should be treated also. No sexually activity.  Spoke with patient and notified vaginitis testing negative for yeast, but positive for BV and Trichomonas. Advised trichomonas is a STD and partner needs to be treated. She will have him get tested. Rx sent to pharmacy on file for Flagyl 500mg  #14 1 po bid, NR. ETOH precautions given and no sexual activity until both treated and for 1 week after. TOC appointment made for 03-15-19 11:00am.

## 2019-03-01 ENCOUNTER — Other Ambulatory Visit: Payer: Self-pay

## 2019-03-04 ENCOUNTER — Ambulatory Visit (INDEPENDENT_AMBULATORY_CARE_PROVIDER_SITE_OTHER): Payer: Self-pay | Admitting: Certified Nurse Midwife

## 2019-03-04 ENCOUNTER — Other Ambulatory Visit: Payer: Self-pay

## 2019-03-04 ENCOUNTER — Encounter: Payer: Self-pay | Admitting: Certified Nurse Midwife

## 2019-03-04 VITALS — BP 110/78 | HR 64 | Temp 97.2°F | Resp 16 | Wt 234.0 lb

## 2019-03-04 DIAGNOSIS — N898 Other specified noninflammatory disorders of vagina: Secondary | ICD-10-CM

## 2019-03-04 NOTE — Patient Instructions (Signed)
Preventing Sexually Transmitted Infections, Adult Sexually transmitted infections (STIs) are diseases that are passed (transmitted) from person to person through bodily fluids exchanged during sex or sexual contact. Bodily fluids include saliva, semen, blood, vaginal mucus, and urine. You may have an increased risk for developing an STI if you have unprotected oral, vaginal, or anal sex. Some common STIs include:  Herpes.  Hepatitis B.  Chlamydia.  Gonorrhea.  Syphilis.  HPV (human papillomavirus).  HIV (human immunodeficiency virus), the virus that can cause AIDS (acquired immunodeficiency syndrome). How can I protect myself from sexually transmitted infections? The only way to completely prevent STIs is not to have sex of any kind (practice abstinence). This includes oral, vaginal, or anal sex. If you are sexually active, take these actions to lower your risk of getting an STI:  Have only one sex partner (be monogamous) or limit the number of sexual partners you have.  Stay up-to-date on immunizations. Certain vaccines can lower your risk of getting certain STIs, such as: ? Hepatitis A and B vaccines. You may have been vaccinated as a young child, but likely need a booster shot as a teen or young adult. ? HPV vaccine.  Use methods that prevent the exchange of body fluids between partners (barrier protection) every time you have sex. Barrier protection can be used during oral, vaginal, or anal sex. Commonly used barrier methods include: ? Female condom. ? Female condom. ? Dental dam.  Get tested regularly for STIs. Have your sexual partner get tested regularly as well.  Avoid mixing alcohol, drugs, and sex. Alcohol and drug use can affect your ability to make good decisions and can lead to risky sexual behaviors.  Ask your health care provider about taking pre-exposure prophylaxis (PrEP) to prevent HIV infection if you: ? Have a HIV-positive sexual partner. ? Have multiple sexual  partners or partners who do not know their HIV status, and do not regularly use a condom during sex. ? Use injection drugs and share needles. Birth control pills, injections, implants, and intrauterine devices (IUDs) do not protect against STIs. To prevent both STIs and pregnancy, always use a condom with another form of birth control. Some STIs, such as herpes, are spread through skin to skin contact. A condom does not protect you from getting such STIs. If you or your partner have herpes and there is an active flare with open sores, avoid all sexual contact. Why are these changes important? Taking steps to practice safe sex protects you and others. Many STIs can be cured. However, some STIs are not curable and will affect you for the rest of your life. STIs can be passed on to another person even if you do not have symptoms. What can happen if changes are not made? Certain STIs may:  Require you to take medicine for the rest of your life.  Affect your ability to have children (your fertility).  Increase your risk for developing another STI or certain serious health conditions, such as: ? Cervical cancer. ? Head and neck cancer. ? Pelvic inflammatory disease (PID) in women. ? Organ damage or damage to other parts of your body, if the infection spreads.  Be passed to a baby during childbirth. How are sexually transmitted infections treated? If you or your partner know or think that you may have an STI:  Talk with your health care provider about what can be done to treat it. Some STIs can be treated and cured with medicines.  For curable STIs, you and   your partner should avoid sex during treatment and for several days after treatment is complete.  You and your partner should both be treated at the same time, if there is any chance that your partner is infected as well. If you get treatment but your partner does not, your partner can re-infect you when you resume sexual contact.  Do not  have unprotected sex. Where to find more information Learn more about sexually transmitted diseases and infections from:  Centers for Disease Control and Prevention: ? More information about specific STIs: www.cdc.gov/std ? Find places to get sexual health counseling and treatment for free or for a low cost: gettested.cdc.gov  U.S[place] Department of Health and Human Services: www.womenshealth.gov/publications/our-publications/fact-sheet/sexually-transmitted-infections.html Summary  The only way to completely prevent STIs is not to have sex (practice abstinence), including oral, vaginal, or anal sex.  STIs can spread through saliva, semen, blood, vaginal mucus, urine, or sexual contact.  If you do have sex, limit your number of sexual partners and use a barrier protection method every time you have sex.  If you develop an STI, get treated right away and ask your partner to be treated as well. Do not resume having sex until both of you have completed treatment for the STI. This information is not intended to replace advice given to you by your health care provider. Make sure you discuss any questions you have with your health care provider. Document Released: 07/14/2016 Document Revised: 12/22/2017 Document Reviewed: 07/14/2016 Elsevier Patient Education  2020 Elsevier Inc.  

## 2019-03-04 NOTE — Progress Notes (Signed)
33 y.o. Single Caucasian female G0P0000 here with complaint of vaginal symptoms of sticky discharge,with bump on labia that was painful 1 week ago. Denies blister like appearance, just "white like acne", no burning or itching from area. Once she, had period which was normal, it seemed to have decreased in size and no pain now. Discharge has slight odor, no burning or itching. Also would like to have STD screening. Due to previous positive trichomonas.  Denies new personal products or vaginal dryness or new partner.  Urinary symptoms none . Contraception is OCP. No other health issues today.  Review of Systems  Constitutional: Negative.   HENT: Negative.   Eyes: Negative.   Respiratory: Negative.   Cardiovascular: Negative.   Gastrointestinal: Negative.   Genitourinary: Negative.        Bump on vulva resolving, vaginal discharge  Musculoskeletal: Negative.   Skin: Negative.   Neurological: Negative.   Endo/Heme/Allergies: Negative.   Psychiatric/Behavioral: Negative.     O:Healthy female WDWN Affect: normal, orientation x 3  Exam: Skin: warm and dry Abdomen: soft, non tender, no masses  Inguinal Lymph nodes: no enlargement or tenderness Pelvic exam: External genital: normal female, tiny resolving sebaceous cyst noted on left labia, very slight tenderness, not read, no exudate BUS: negative Vagina: creamy brown slight odorous discharge noted.  Affirm taken, Gc/Chlamydia taken Cervix: normal, non tender, no CMT Uterus: normal, non tender Adnexa:normal, non tender, no masses or fullness noted.   A:Normal pelvic exam Healing left labial sebacous cyst STD screening   P:Discussed findings of left labial healing sebaceous cyst and etiology. No herpetic type blister or other finding noted. Discussed Aveeno or baking soda sitz bath for comfort if needed. Avoid moist clothes  for extended period of time as this contribute to sebaceous cyst and yeast vaginitis.. If working out in gym  clothes or swim suits for long periods of time change underwear or bottoms of swimsuit if possible. Coconut Oil use for skin protection prior to activity can be used to external skin for protection. Questions addressed. Will treat if indicated by lab. Recommend all STD screening be done if positive. Stressed condom use.  Lab: Affirm, GC/Chlamydia  Rv prn

## 2019-03-05 LAB — GC/CHLAMYDIA PROBE AMP
Chlamydia trachomatis, NAA: NEGATIVE
Neisseria Gonorrhoeae by PCR: NEGATIVE

## 2019-03-05 LAB — VAGINITIS/VAGINOSIS, DNA PROBE
Candida Species: NEGATIVE
Gardnerella vaginalis: NEGATIVE
Trichomonas vaginosis: NEGATIVE

## 2019-03-15 ENCOUNTER — Ambulatory Visit: Payer: Self-pay | Admitting: Certified Nurse Midwife

## 2019-03-24 ENCOUNTER — Other Ambulatory Visit: Payer: Self-pay | Admitting: Certified Nurse Midwife

## 2019-03-25 NOTE — Telephone Encounter (Signed)
Medication refill request: [place] Last AEX:  03/27/18 DL Next AEX: 03/29/19 Last MMG (if hormonal medication request): n/a Refill authorized: 09/13/18 Please advise on refill; Order pended #28 w/0 refills if authorized

## 2019-03-29 ENCOUNTER — Ambulatory Visit: Payer: 59 | Admitting: Certified Nurse Midwife

## 2019-05-19 ENCOUNTER — Other Ambulatory Visit: Payer: Self-pay | Admitting: Certified Nurse Midwife

## 2019-05-27 ENCOUNTER — Other Ambulatory Visit: Payer: Self-pay | Admitting: Certified Nurse Midwife

## 2019-05-30 ENCOUNTER — Telehealth: Payer: Self-pay | Admitting: Certified Nurse Midwife

## 2019-05-30 NOTE — Telephone Encounter (Signed)
Patient's prescription for birth control was denied because she need aex. She has no insurance  and can't come in right now.

## 2019-05-31 ENCOUNTER — Other Ambulatory Visit: Payer: Self-pay | Admitting: Certified Nurse Midwife

## 2019-05-31 DIAGNOSIS — Z3041 Encounter for surveillance of contraceptive pills: Secondary | ICD-10-CM

## 2019-05-31 MED ORDER — NORETHIN ACE-ETH ESTRAD-FE 1-20 MG-MCG PO TABS: 1.0000 | ORAL_TABLET | Freq: Every day | ORAL | 2 refills | Status: DC

## 2019-05-31 NOTE — Telephone Encounter (Signed)
Left message to call Sharee Pimple, RN at [facility].    Last AEX 03/27/18

## 2019-05-31 NOTE — Telephone Encounter (Signed)
Patient returned call

## 2019-05-31 NOTE — Telephone Encounter (Signed)
Spoke with patient. LAst AEX 03/27/18, patient is requesting refill of OCP. Patient does not have insurance currently, plans to have a new plan in January. AEX scheduled for 08/23/19 at 1:30pm. Runs out of OCP today.   Patient ask for other ways to get OCP if no insurance, reviewed options [facility] as [facility] and [facility].   Patient request that Melvia Heaps, CNM refill until next AEX. Advised I will have to review with provider and return call, patient agreeable.  Melvia Heaps, CNM -please advise.

## 2019-05-31 NOTE — Telephone Encounter (Signed)
I will refill her Rx, saw her physically in 03/2019. Order placed

## 2019-05-31 NOTE — Telephone Encounter (Signed)
Patient is returning call to Jill. °

## 2019-06-03 NOTE — Telephone Encounter (Signed)
Call returned to patient, left detailed message, ok per dpr. Advised per Melvia Heaps, CNM. Rx has been sent to [facility] in [place], [address]. Keep AEX as scheduled for refills. Return call to office if any questions.  Encounter closed.

## 2019-08-23 ENCOUNTER — Ambulatory Visit: Payer: 59 | Admitting: Certified Nurse Midwife

## 2019-08-23 ENCOUNTER — Encounter: Payer: Self-pay | Admitting: Certified Nurse Midwife

## 2019-08-23 NOTE — Progress Notes (Deleted)
34 y.o. G0P0000 Single  {Race/ethnicity:17218} Fe here for annual exam.    No LMP recorded.          Sexually active: {yes no:314532}  The current method of family planning is {contraception:315051}.    Exercising: {yes no:314532}  {types:19826} Smoker:  {YES NO:22349}  ROS  Health Maintenance: Pap:  03-27-18 neg HPV HR+, 16 18/45 neg History of Abnormal Pap: yes MMG:  none Self Breast exams: {YES NO:22349} Colonoscopy:  none BMD:   none TDaP:  2015 Shingles: no Pneumonia: no Hep C and HIV: both neg 2019 Labs: ***   reports that she has never smoked. She has never used smokeless tobacco. She reports previous alcohol use. She reports current drug use. Drug: Marijuana.  Past Medical History:  Diagnosis Date  . Abnormal Pap smear of cervix    2018 & 2019 neg HPV HR+, 16,18/45 neg  . Allergic rhinitis    Allergy- shots with Dr Sharyn Lull  . Allergy   . Amenorrhea   . Anxiety    has a history of panic disorder  . Asthma   . Bipolar 2 disorder ([facility])   . Depression    treated with anxiety- current  counseling   . GERD (gastroesophageal reflux disease)   . Herniated disc    back  . History of bronchitis   . History of chicken pox    childhood  . History of headache   . History of migraines    last was 1 month ago   . Hypertension   . Menorrhagia   . PCOS (polycystic ovarian syndrome)    takes Metformin   . STD (sexually transmitted disease)    trichomonas treated 7/10    Past Surgical History:  Procedure Laterality Date  . LAPAROSCOPIC GASTRIC SLEEVE RESECTION N/A 05/14/2018   Procedure: LAPAROSCOPIC GASTRIC SLEEVE RESECTION WITH UPPER ENDO AND ERAS PATHWAY;  Surgeon: Luretha Murphy, MD;  Location: [facility];  Service: General;  Laterality: N/A;  . WISDOM TOOTH EXTRACTION  2003    Current Outpatient Medications  Medication Sig Dispense Refill  . escitalopram (LEXAPRO) 5 MG tablet Take 5 mg by mouth at bedtime.  2  . lamoTRIgine (LAMICTAL) 150 MG tablet Take 150 mg  by mouth at bedtime.   1  . lithium 300 MG tablet Take 600 mg by mouth daily.    . Multiple Vitamins-Minerals (BARIATRIC MULTIVITAMINS/IRON PO) Take 1 tablet by mouth as needed.     . norethindrone-ethinyl estradiol (BLISOVI FE 1/20) 1-20 MG-MCG tablet Take 1 tablet by mouth daily. 28 tablet 2  . propranolol (INDERAL) 20 MG tablet Take 20 mg by mouth every 6 (six) hours as needed for anxiety.  2   No current facility-administered medications for this visit.    Family History  Problem Relation Age of Onset  . Alcohol abuse Mother   . Hyperlipidemia Mother   . Heart disease Mother   . Anxiety disorder Mother   . Hyperlipidemia Father   . Diabetes Father   . Anxiety disorder Sister   . Hyperlipidemia Maternal Grandmother   . Alzheimer's disease Maternal Grandmother   . Pancreatic cancer Paternal Grandfather   . Stroke Maternal Grandfather   . Alcohol abuse Maternal Grandfather     ROS:  Pertinent items are noted in HPI.  Otherwise, a comprehensive ROS was negative.  Exam:   There were no vitals taken for this visit.   Ht Readings from Last 3 Encounters:  09/11/18 5\' 7"  (1.702 m)  07/10/18 5'  7" (1.702 m)  05/30/18 5\' 7"  (1.702 m)    General appearance: alert, cooperative and appears stated age Head: Normocephalic, without obvious abnormality, atraumatic Neck: no adenopathy, supple, symmetrical, trachea midline and thyroid {EXAM; THYROID:18604} Lungs: clear to auscultation bilaterally Breasts: {Exam; breast:13139::"normal appearance, no masses or tenderness"} Heart: regular rate and rhythm Abdomen: soft, non-tender; no masses,  no organomegaly Extremities: extremities normal, atraumatic, no cyanosis or edema Skin: Skin color, texture, turgor normal. No rashes or lesions Lymph nodes: Cervical, supraclavicular, and axillary nodes normal. No abnormal inguinal nodes palpated Neurologic: Grossly normal   Pelvic: External genitalia:  no lesions              Urethra:  normal  appearing urethra with no masses, tenderness or lesions              Bartholin's and Skene's: normal                 Vagina: normal appearing vagina with normal color and discharge, no lesions              Cervix: {exam; cervix:14595}              Pap taken: {yes no:314532} Bimanual Exam:  Uterus:  {exam; uterus:12215}              Adnexa: {exam; adnexa:12223}               Rectovaginal: Confirms               Anus:  normal sphincter tone, no lesions  Chaperone present: ***  A:  Well Woman with normal exam  P:   Reviewed health and wellness pertinent to exam  Pap smear: {YES NO:22349}  {plan; gyn:5269::"mammogram","pap smear","return annually or prn"}  An After Visit Summary was printed and given to the patient.

## 2019-09-30 ENCOUNTER — Other Ambulatory Visit: Payer: Self-pay

## 2019-10-01 ENCOUNTER — Encounter: Payer: Self-pay | Admitting: Obstetrics and Gynecology

## 2019-10-01 ENCOUNTER — Other Ambulatory Visit (HOSPITAL_COMMUNITY)
Admission: RE | Admit: 2019-10-01 | Discharge: 2019-10-01 | Disposition: A | Discharge status: Home / Self Care | Payer: 59 | Source: Ambulatory Visit | Attending: Obstetrics and Gynecology | Admitting: Obstetrics and Gynecology

## 2019-10-01 ENCOUNTER — Ambulatory Visit (INDEPENDENT_AMBULATORY_CARE_PROVIDER_SITE_OTHER): Payer: 59 | Admitting: Obstetrics and Gynecology

## 2019-10-01 VITALS — BP 118/74 | HR 90 | Temp 97.0°F | Resp 16 | Ht 67.25 in | Wt 247.0 lb

## 2019-10-01 DIAGNOSIS — Z113 Encounter for screening for infections with a predominantly sexual mode of transmission: Secondary | ICD-10-CM | POA: Insufficient documentation

## 2019-10-01 DIAGNOSIS — Z01419 Encounter for gynecological examination (general) (routine) without abnormal findings: Secondary | ICD-10-CM | POA: Insufficient documentation

## 2019-10-01 MED ORDER — NORETHINDRONE 0.35 MG PO TABS: 1.0000 | ORAL_TABLET | Freq: Every day | ORAL | 3 refills | Status: AC

## 2019-10-01 NOTE — Patient Instructions (Signed)

## 2019-10-01 NOTE — Progress Notes (Signed)
34 y.o. G0P0000 Single Caucasian female here for annual exam.    Wants her COCs refilled.  Menses are well controlled.  She likes this because it controls her mood.   She has migraine with aura.  States she sees spots prior to her headache. Takes Excedrin.   Hx PCOS and restarted Metformin.   She wants Gardasil.   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   PCP: Yaakov Guthrie, MD    Patient's last menstrual period was 09/27/2019 (exact date).     Period Cycle (Days): 30 Period Duration (Days): 5 days Period Pattern: Regular Menstrual Flow: Moderate Menstrual Control: Tampon Menstrual Control Change Freq (Hours): 6-8 hours on heaviest day Dysmenorrhea: (!) Mild     Sexually active: Yes.    The current method of family planning is OCP (estrogen/progesterone).    Exercising: No.  The patient does not participate in regular exercise at present. Smoker:  no  Health Maintenance: Pap: 03-27-18 Neg:Pos HR HPV--Neg 16/18/45, 02-09-17 Neg:Pos HR HPV--neg 16/18/45, 02-22-14 Neg History of abnormal Pap:  Yes,  03-27-18 Neg:Pos HR HPV--Neg 16/18/45, 02-09-17 Neg:Pos HR HPV--neg 16/18/45 MMG:  n/a Colonoscopy:  n/a BMD:   n/a  Result  n/a TDaP: 02-18-14 Gardasil:   no HIV:09-13-17 NR Hep C:09-13-17 Neg Screening Labs:  PCP   reports that she has never smoked. She has never used smokeless tobacco. She reports current alcohol use. She reports current drug use. Drug: Marijuana.  Past Medical History:  Diagnosis Date  . Abnormal Pap smear of cervix    2018 & 2019 neg HPV HR+, 16,18/45 neg  . Allergic rhinitis    Allergy- shots with Dr Carmelina Peal  . Allergy   . Amenorrhea   . Anxiety    has a history of panic disorder  . Asthma   . Bipolar 2 disorder ([place])   . Depression    treated with anxiety- current  counseling   . GERD (gastroesophageal reflux disease)   . Herniated disc    back  . History of bronchitis   . History of chicken pox    childhood  . History of headache   . History  of migraines    last was 1 month ago, with aura  . Hypertension   . Menorrhagia   . PCOS (polycystic ovarian syndrome)    takes Metformin   . STD (sexually transmitted disease)    trichomonas treated 7/10    Past Surgical History:  Procedure Laterality Date  . LAPAROSCOPIC GASTRIC SLEEVE RESECTION N/A 05/14/2018   Procedure: LAPAROSCOPIC GASTRIC SLEEVE RESECTION WITH UPPER ENDO AND ERAS PATHWAY;  Surgeon: Johnathan Hausen, MD;  Location: [facility];  Service: General;  Laterality: N/A;  . WISDOM TOOTH EXTRACTION  2003    Current Outpatient Medications  Medication Sig Dispense Refill  . escitalopram (LEXAPRO) 5 MG tablet Take 5 mg by mouth at bedtime.  2  . lamoTRIgine (LAMICTAL) 150 MG tablet Take 150 mg by mouth at bedtime.   1  . metFORMIN (GLUCOPHAGE-XR) 500 MG 24 hr tablet Take 500 mg by mouth at bedtime.    . Multiple Vitamin (MULTIVITAMIN) capsule Take 1 capsule by mouth daily.    . norethindrone-ethinyl estradiol (BLISOVI FE 1/20) 1-20 MG-MCG tablet Take 1 tablet by mouth daily. 28 tablet 2  . propranolol (INDERAL) 20 MG tablet Take 20 mg by mouth every 6 (six) hours as needed for anxiety.  2   No current facility-administered medications for this visit.    Family History  Problem  Relation Age of Onset  . Alcohol abuse Mother   . Hyperlipidemia Mother   . Heart disease Mother   . Anxiety disorder Mother   . Hyperlipidemia Father   . Diabetes Father   . Anxiety disorder Sister   . Hyperlipidemia Maternal Grandmother   . Alzheimer's disease Maternal Grandmother   . Pancreatic cancer Paternal Grandfather   . Stroke Maternal Grandfather   . Alcohol abuse Maternal Grandfather     Review of Systems  All other systems reviewed and are negative.   Exam:   BP 118/74 (Cuff Size: Large)   Pulse 90   Temp (!) 97 F (36.1 C) (Temporal)   Resp 16   Ht 5' 7.25" (1.708 m)   Wt 247 lb (112 kg)   LMP 09/27/2019 (Exact Date)   BMI 38.40 kg/m     General appearance: alert,  cooperative and appears stated age Head: normocephalic, without obvious abnormality, atraumatic Neck: no adenopathy, supple, symmetrical, trachea midline and thyroid normal to inspection and palpation Lungs: clear to auscultation bilaterally Breasts: normal appearance, no masses or tenderness, No nipple retraction or dimpling, No nipple discharge or bleeding, No axillary adenopathy Heart: regular rate and rhythm Abdomen: soft, non-tender; no masses, no organomegaly Extremities: extremities normal, atraumatic, no cyanosis or edema Skin: skin color, texture, turgor normal. No rashes or lesions Lymph nodes: cervical, supraclavicular, and axillary nodes normal. Neurologic: grossly normal  Pelvic: External genitalia:  no lesions              No abnormal inguinal nodes palpated.              Urethra:  normal appearing urethra with no masses, tenderness or lesions              Bartholins and Skenes: normal                 Vagina: normal appearing vagina with normal color and discharge, no lesions              Cervix: no lesions              Pap taken: Yes.   Bimanual Exam:  Uterus:  normal size, contour, position, consistency, mobility, non-tender              Adnexa: no mass, fullness, tenderness            Chaperone was present for exam.  Assessment:   Well woman visit with normal exam. Positive HR HPV.  Hx migraine with aura.  Hx PCOS.  Status post weight loss surgery.    Plan: Mammogram screening discussed. Self breast awareness reviewed. Pap and HR HPV as above. Guidelines for Calcium, Vitamin D, regular exercise program including cardiovascular and weight bearing exercise. Stop COCs.  She will start Micronor.  STD screening.  Routine labs with PCP.  Gardasil vaccine after Covid vaccine is completed. Follow up annually and prn.   After visit summary provided.

## 2019-10-02 ENCOUNTER — Telehealth: Payer: Self-pay | Admitting: Certified Nurse Midwife

## 2019-10-02 LAB — CYTOLOGY - PAP
Chlamydia: NEGATIVE
Comment: NEGATIVE
Comment: NEGATIVE
Comment: NEGATIVE
Comment: NORMAL
Diagnosis: NEGATIVE
High risk HPV: NEGATIVE
Neisseria Gonorrhea: NEGATIVE
Trichomonas: NEGATIVE

## 2019-10-02 LAB — HEP, RPR, HIV PANEL
HIV Screen 4th Generation wRfx: NONREACTIVE
Hepatitis B Surface Ag: NEGATIVE
RPR Ser Ql: NONREACTIVE

## 2019-10-02 LAB — HEPATITIS C ANTIBODY: Hep C Virus Ab: <0.1 s/co ratio (ref 0.0–0.9)

## 2019-10-02 NOTE — Telephone Encounter (Signed)
[place]  Phone Number: [phone]  Alain Marion!   First - congrats on retiring! I will definitely miss you but am very excited and happy for you.   Second- I saw doctor [facility] yesterday. She told me that because I have migraines with an aura I shouldn't take estrogen based birth control which I'm honestly not thrilled about. I chose to go with a progestin pill but she said it could cause acne and I'm mostly concerned about what it will do to my moods with my bipolar. I would really appreciate your opinion on this if you're willing to give it! I've been taking the same birth control (minus trying nuvaring) since I was 19 and am happy with it.   Thank you!!!   Eugenia Pancoast

## 2019-10-02 NOTE — Telephone Encounter (Signed)
Routing to Deborah Leonard, CNM.  

## 2019-10-03 NOTE — Telephone Encounter (Signed)
Spoke with patient, advised as seen below per Leota Sauers, CNM. Patient verbalizes understanding and is agreeable. Patient is aware to call if any questions/concerns.   Routing to provider for final review. Patient is agreeable to disposition. Will close encounter.

## 2019-10-03 NOTE — Telephone Encounter (Signed)
According to note with exam she had aura with last headache. No issues in the past, that she has noted per chart. She should per recommendations with contraception use progesterone only contraception, whether it is the pill, IUD or Nexplanon. I usually don't see a change with the pill use and would see how she does for the next 3 months. Her period profile will probably change, so she needs to be aware it can be different with a regular period, or no period or sometimes just spotting. This is normal. I have enjoyed having her as patient very much and proud of her weight loss journey!!

## 2019-10-06 ENCOUNTER — Encounter: Payer: Self-pay | Admitting: Obstetrics and Gynecology

## 2019-10-07 ENCOUNTER — Telehealth: Payer: Self-pay | Admitting: Obstetrics and Gynecology

## 2019-10-07 NOTE — Telephone Encounter (Signed)
Routing MyChart message to Dr. Edward Jolly to review.

## 2019-10-07 NOTE — Telephone Encounter (Signed)
Patient sent the following correspondence through MyChart.  Hello Dr. Edward Jolly,  I went to the pharmacy to pick up my new birth control. My pharmacist told me that my lamictal negatively interacts w the new birth control and can make it less effective. He asked me about the issue and I told him. He felt encouraged that I really only have migraines 1-2 times per year. They've become way less frequent as I've gotten older. I understand the risks but if it's okay id really like to keep my old birth control. I've been taking it since I was 19 other than trying nuvaring which I didn't like. It's worked wonders for my moods.   Thank you,  Jessica Castro

## 2019-10-08 NOTE — Telephone Encounter (Signed)
Left message to call Kadisha Goodine, RN at [facility] [phone].   

## 2019-10-08 NOTE — Telephone Encounter (Signed)
Spoke with patient, advised as seen below per Dr. Edward Jolly. Patient states she understands what the risk are, but has been on oral birth control pills that contain estrogen for many years, has history of bipolar and does not want to risk change in her mood. Patient declines IUD, states this was discussed in office, does not want something that she can easily stop if she notices changes in mood.   Offered OV or MyChart visit to further discuss with Dr. Edward Jolly, patient declined. Patient states she will find another provider that will prescribe the medication. Call ended by patient.    Routing to Dr. Marjorie Smolder.   Encounter closed.

## 2019-10-08 NOTE — Telephone Encounter (Signed)
Up to Date search done by me just now.   Estrogen containing birth control can make Lamictal less effective.  She also has migraine with aura, so estrogen containing contraception is not recommended due to risk of stroke.   If she does not want to take Micronor, would she consider a progesterone IUD or copper IUD for pregnancy prevention?

## 2019-10-18 ENCOUNTER — Encounter: Payer: Self-pay | Admitting: Certified Nurse Midwife

## 2019-10-22 ENCOUNTER — Telehealth: Payer: Self-pay | Admitting: Obstetrics and Gynecology

## 2019-10-22 DIAGNOSIS — Z30430 Encounter for insertion of intrauterine contraceptive device: Secondary | ICD-10-CM

## 2019-10-22 NOTE — Telephone Encounter (Signed)
Patient would like to get the paraguard  IUD. She asked for Dr.Miller or Dr. Oscar La possible.

## 2019-10-22 NOTE — Telephone Encounter (Signed)
Left message for pt to call and speak with Shaneka Efaw, RN in triage.  

## 2019-10-29 NOTE — Telephone Encounter (Signed)
Spoke to pt. Pt states wanting the Paraguard IUD inserted. Pt LMP 09/27/2019. Pt advised to take home UPT since hasn't  started this month yet, (day 32). Pt states not worried and is careful and BF lives out of town. Pt took last pack of OCPs on 09/21/2019. Former pt of [facility]. Pt requests Friday appt. Pt scheduled for IUD insertion on 11/15/2019 at 1130 am with Dr Hyacinth Meeker as well as having 1st Gardasil injection. Pt instructed to take Motrin 800 mg with food and water one hour before procedure. Pt agreeable. Pt aware of call for benefits. Pt verbalized understanding.   Routing to Dr Hyacinth Meeker for review and will close encounter.  Cc: Hedda Slade for precert. Orders placed.

## 2019-10-30 ENCOUNTER — Telehealth: Payer: Self-pay | Admitting: Obstetrics & Gynecology

## 2019-10-30 NOTE — Telephone Encounter (Signed)
Call placed to convey benefits for iud insertion. 

## 2019-11-05 NOTE — Telephone Encounter (Signed)
See phone encounter dated 10/22/2019.  Encounter closed.

## 2019-11-05 NOTE — Telephone Encounter (Signed)
Patient is returning a call to Stephanie. °

## 2019-11-05 NOTE — Telephone Encounter (Signed)
Left message for pt to return call to triage RN. 

## 2019-11-05 NOTE — Telephone Encounter (Signed)
Spoke with pt. Pt had last SA on 10/20/2019 and had normal cycle for LMP on 11/01/2019. Pt advised to abstain from SA until IUD insertion appt on 11/15/2019. Pt verbalized understanding and is agreeable.   Routing to Dr Hyacinth Meeker for review.  Encounter closed.

## 2019-11-05 NOTE — Telephone Encounter (Signed)
Please confirm with pt when last episode of intercourse occurred.  She needs to abstain until IUD inserted as is now 11 days until appt.  If has been SA in the last three days, will need to wait until onset of menstrual cycle to schedule.

## 2019-11-11 ENCOUNTER — Telehealth: Payer: Self-pay | Admitting: Obstetrics & Gynecology

## 2019-11-11 NOTE — Telephone Encounter (Signed)
Patient cancelled iud insertion for Friday because of a family emergency. Would like a call to reschedule.

## 2019-11-12 NOTE — Telephone Encounter (Signed)
Call to patient to reschedule IUD insertion. Patient is rescheduled for 12/09/2019 at 230PM with Dr. Hyacinth Meeker. Encounter closed.

## 2019-11-15 ENCOUNTER — Ambulatory Visit: Payer: 59

## 2019-11-15 ENCOUNTER — Ambulatory Visit: Payer: Self-pay | Admitting: Obstetrics & Gynecology

## 2019-12-04 ENCOUNTER — Telehealth: Payer: Self-pay | Admitting: *Deleted

## 2019-12-04 NOTE — Telephone Encounter (Signed)
Patient cancelled paraguard insertion Monday because of family emergency. Will call back to reschedule.

## 2019-12-04 NOTE — Telephone Encounter (Signed)
Routed to Dr. Gloris Ham.   Encounter closed.

## 2019-12-06 ENCOUNTER — Encounter (HOSPITAL_COMMUNITY): Payer: Self-pay

## 2019-12-09 ENCOUNTER — Ambulatory Visit: Payer: Self-pay | Admitting: Obstetrics & Gynecology

## 2020-02-27 ENCOUNTER — Telehealth: Payer: Self-pay | Admitting: Obstetrics & Gynecology

## 2020-02-27 NOTE — Telephone Encounter (Signed)
Call to patient. Per DPR, OK to leave message on voicemail.   Left voicemail requesting a return call to [facility] to schedule IUD insertion with M. Leda Quail, MD.

## 2020-10-01 ENCOUNTER — Ambulatory Visit: Payer: 59 | Admitting: Obstetrics and Gynecology

## 2020-12-09 ENCOUNTER — Encounter (HOSPITAL_COMMUNITY): Payer: Self-pay | Admitting: *Deleted

## 2022-10-22 ENCOUNTER — Emergency Department (HOSPITAL_COMMUNITY)
Admission: EM | Admit: 2022-10-22 | Discharge: 2022-10-22 | Disposition: A | Discharge status: Home / Self Care | Payer: Self-pay | Attending: Emergency Medicine | Admitting: Emergency Medicine

## 2022-10-22 ENCOUNTER — Other Ambulatory Visit: Payer: Self-pay

## 2022-10-22 DIAGNOSIS — R55 Syncope and collapse: Secondary | ICD-10-CM | POA: Insufficient documentation

## 2022-10-22 DIAGNOSIS — Z91040 Latex allergy status: Secondary | ICD-10-CM | POA: Insufficient documentation

## 2022-10-22 DIAGNOSIS — Z91010 Allergy to peanuts: Secondary | ICD-10-CM | POA: Insufficient documentation

## 2022-10-22 DIAGNOSIS — J45909 Unspecified asthma, uncomplicated: Secondary | ICD-10-CM | POA: Insufficient documentation

## 2022-10-22 LAB — BASIC METABOLIC PANEL
Anion gap: 9 (ref 5–15)
BUN: 14 mg/dL (ref 6–20)
CO2: 24 mmol/L (ref 22–32)
Calcium: 9 mg/dL (ref 8.9–10.3)
Chloride: 107 mmol/L (ref 98–111)
Creatinine, Ser: 0.97 mg/dL (ref 0.44–1.00)
GFR, Estimated: >60 mL/min (ref >60)
Glucose, Bld: 106 mg/dL — ABNORMAL HIGH (ref 70–99)
Potassium: 3.5 mmol/L (ref 3.5–5.1)
Sodium: 140 mmol/L (ref 135–145)

## 2022-10-22 LAB — URINALYSIS, ROUTINE W REFLEX MICROSCOPIC
Bilirubin Urine: NEGATIVE
Glucose, UA: NEGATIVE mg/dL
Ketones, ur: 5 mg/dL — AB
Nitrite: NEGATIVE
Protein, ur: 30 mg/dL — AB
Specific Gravity, Urine: 1.028 (ref 1.005–1.030)
pH: 5 (ref 5.0–8.0)

## 2022-10-22 LAB — CBC
HCT: 36.1 % (ref 36.0–46.0)
Hemoglobin: 11.4 g/dL — ABNORMAL LOW (ref 12.0–15.0)
MCH: 25.9 pg — ABNORMAL LOW (ref 26.0–34.0)
MCHC: 31.6 g/dL (ref 30.0–36.0)
MCV: 81.9 fL (ref 80.0–100.0)
Platelets: 244 10*3/uL (ref 150–400)
RBC: 4.41 MIL/uL (ref 3.87–5.11)
RDW: 14.2 % (ref 11.5–15.5)
WBC: 6.4 10*3/uL (ref 4.0–10.5)
nRBC: 0 % (ref 0.0–0.2)

## 2022-10-22 LAB — I-STAT BETA HCG BLOOD, ED (MC, WL, AP ONLY): I-stat hCG, quantitative: <5 m[IU]/mL (ref <5)

## 2022-10-22 NOTE — ED Triage Notes (Signed)
Pt states she felt like passing out 15 min pta Denies dizziness or pain

## 2022-10-22 NOTE — Discharge Instructions (Signed)
You were seen in the emergency department today for near syncopal episode.  As we discussed your workup was reassuring today.  We did not find an emergent etiology for your symptoms today.  Please follow up with your primary care provider regarding your visit today. If you do not have a primary care provider, you may reach out to [facility] at [phone] to establish with one and make your first appointment.  Make sure you are monitoring your stress level, and staying hydrated.  Return to the emergency department for any new or worsening symptoms.

## 2022-10-22 NOTE — ED Provider Notes (Signed)
[place] EMERGENCY DEPARTMENT AT [facility] Provider Note   CSN: [id number] Arrival date & time: 10/22/22  1253     History  Chief Complaint  Patient presents with   Near Syncope    Jessica Castro is a 37 y.o. female history of asthma, depression, PCOS, bipolar 2 disorder who presents the emergency department complaining of near syncope.  Patient states that she was passenger in a car, when in the middle of her conversation, she had a "funny feeling", over her whole body.  She described it as a similar sensation as to when she has received saline infusion after donating plasma.  She felt as though she was going to pass out, and her vision was blacking out.  Denies any dizziness, headache, chest pain, shortness of breath.  No cardiac history.  No family history of sudden cardiac death.  Patient did state that few days ago she had a stomach virus, started feeling better about 2 days ago.  Has been trying to hydrate better since then.  Also reports is under increased stress recently, works as a Paramedic.   Near Syncope       Home Medications Prior to Admission medications   Medication Sig Start Date End Date Taking? Authorizing Provider  escitalopram (LEXAPRO) 5 MG tablet Take 5 mg by mouth at bedtime. 04/05/18   [provider]  lamoTRIgine (LAMICTAL) 150 MG tablet Take 150 mg by mouth at bedtime.  01/15/18   [provider]  metFORMIN (GLUCOPHAGE-XR) 500 MG 24 hr tablet Take 500 mg by mouth at bedtime. 08/30/19   [provider]  Multiple Vitamin (MULTIVITAMIN) capsule Take 1 capsule by mouth daily.    [provider]  norethindrone (MICRONOR) 0.35 MG tablet Take 1 tablet (0.35 mg total) by mouth daily. 10/01/19   Patton Salles, MD  propranolol (INDERAL) 20 MG tablet Take 20 mg by mouth every 6 (six) hours as needed for anxiety. 04/05/18   [provider]      Allergies    Junel fe 1-20 [norethin ace-eth estrad-fe],  Latex, Peanuts [peanut oil], and Adhesive [tape]    Review of Systems   Review of Systems  Cardiovascular:  Positive for near-syncope.  Neurological:  Positive for light-headedness.  All other systems reviewed and are negative.   Physical Exam Updated Vital Signs BP 112/69 (BP Location: Right Arm)   Pulse 71   Temp 98.2 F (36.8 C) (Oral)   Resp 18   Ht 5\' 7"  (1.702 m)   Wt 117.9 kg   SpO2 99%   BMI 40.72 kg/m  Physical Exam Vitals and nursing note reviewed.  Constitutional:      Appearance: Normal appearance.  HENT:     Head: Normocephalic and atraumatic.  Eyes:     Conjunctiva/sclera: Conjunctivae normal.  Cardiovascular:     Rate and Rhythm: Normal rate and regular rhythm.  Pulmonary:     Effort: Pulmonary effort is normal. No respiratory distress.     Breath sounds: Normal breath sounds.  Abdominal:     General: There is no distension.     Palpations: Abdomen is soft.     Tenderness: There is no abdominal tenderness.  Musculoskeletal:     Right lower leg: No edema.     Left lower leg: No edema.  Skin:    General: Skin is warm and dry.  Neurological:     General: No focal deficit present.     Mental Status: She is alert.  Comments: Neuro: Speech is clear, able to follow commands. CN III-XII intact grossly intact. PERRLA. EOMI. Sensation intact throughout. Str 5/5 all extremities.     ED Results / Procedures / Treatments   Labs (all labs ordered are listed, but only abnormal results are displayed) Labs Reviewed  BASIC METABOLIC PANEL - Abnormal; Notable for the following components:      Result Value   Glucose, Bld 106 (*)    All other components within normal limits  URINALYSIS, ROUTINE W REFLEX MICROSCOPIC - Abnormal; Notable for the following components:   APPearance HAZY (*)    Hgb urine dipstick MODERATE (*)    Ketones, ur 5 (*)    Protein, ur 30 (*)    Leukocytes,Ua MODERATE (*)    Bacteria, UA FEW (*)    All other components within normal  limits  CBC - Abnormal; Notable for the following components:   Hemoglobin 11.4 (*)    MCH 25.9 (*)    All other components within normal limits  CBG MONITORING, ED  I-STAT BETA HCG BLOOD, ED ([facility], [facility], [facility] ONLY)    EKG EKG Interpretation  Date/Time:  Saturday October 22 2022 14:01:51 EDT Ventricular Rate:  87 PR Interval:  139 QRS Duration: 99 QT Interval:  363 QTC Calculation: 437 R Axis:   17 Text Interpretation: Sinus rhythm Low voltage, precordial leads Borderline T abnormalities, diffuse leads agree, no sig change from previous Confirmed by Arby Barrette [phone]) on 10/22/2022 2:14:28 PM  Radiology No results found.  Procedures Procedures    Medications Ordered in ED Medications - No data to display  ED Course/ Medical Decision Making/ A&P                             Medical Decision Making Amount and/or Complexity of Data Reviewed Labs: ordered.   This patient is a 37 y.o. female  who presents to the ED for concern of near syncopal episode.   Differential diagnoses prior to evaluation: The emergent differential diagnosis includes, but is not limited to,  CVA, ACS, arrhythmia, vasovagal syncope, orthostatic hypotension, sepsis, hypoglycemia, electrolyte disturbance, respiratory failure, symptomatic anemia, dehydration, heat injury, polypharmacy, malignancy, anxiety/panic attack. This is not an exhaustive differential.   Past Medical History / Co-morbidities: asthma, depression, PCOS, bipolar 2 disorder  Physical Exam: Physical exam performed. The pertinent findings include: Newly hypertensive, but blood pressure normalized after patient was resting.  Otherwise normal vital signs.  No acute distress.  Symptoms have resolved at this time.  Heart regular rate and rhythm, lung sounds clear.  No peripheral edema.  Normal neurologic exam as above.  Lab Tests/Imaging studies: I personally interpreted labs/imaging and the pertinent results include: No leukocytosis,  hemoglobin 11.4, but stable.  BMP unremarkable.  Urinalysis with moderate hemoglobin and moderate leukocytes, but 6-10 squamous cells with mucus, suspect contamination.  Negative pregnancy.  Cardiac monitoring: EKG obtained and interpreted by my attending physician which shows: Sinus rhythm with borderline T wave abnormalities   Disposition: After consideration of the diagnostic results and the patients response to treatment, I feel that emergency department workup does not suggest an emergent condition requiring admission or immediate intervention beyond what has been performed at this time. The plan is: Discharge to home with reassurance.  No emergent etiology found for patient's near syncopal episode today.  Concern for cardiac etiology source of patient's symptoms today, due to tenderness symptoms and resolution during 3 and half hour observation in  the ER, as well as no ischemic changes found on EKG.  She never had chest pain.  Neurologic exam normal, so lower concern for CVA/TIA source of symptoms.  We discussed stress as a possible source, she plans to address this.  Given PCP follow-up information with [facility].  The patient is safe for discharge and has been instructed to return immediately for worsening symptoms, change in symptoms or any other concerns.  Final Clinical Impression(s) / ED Diagnoses Final diagnoses:  Near syncope    Rx / DC Orders ED Discharge Orders     None      Portions of this report may have been transcribed using voice recognition software. Every effort was made to ensure accuracy; however, inadvertent computerized transcription errors may be present.    Jeanella Flattery 10/22/22 1635    Arby Barrette, MD 10/24/22 1555
